# Patient Record
Sex: Male | Born: 1975 | Race: White | Hispanic: No | Marital: Single | Smoking: Never smoker
Health system: Southern US, Community
[De-identification: ages and names within clinical notes are randomized; demographics above are authoritative.]

## PROBLEM LIST (undated history)

## (undated) DIAGNOSIS — R12 Heartburn: Secondary | ICD-10-CM

## (undated) DIAGNOSIS — E663 Overweight: Secondary | ICD-10-CM

## (undated) DIAGNOSIS — E785 Hyperlipidemia, unspecified: Secondary | ICD-10-CM

## (undated) DIAGNOSIS — L409 Psoriasis, unspecified: Secondary | ICD-10-CM

## (undated) DIAGNOSIS — M1990 Unspecified osteoarthritis, unspecified site: Secondary | ICD-10-CM

## (undated) DIAGNOSIS — J351 Hypertrophy of tonsils: Secondary | ICD-10-CM

## (undated) DIAGNOSIS — G4730 Sleep apnea, unspecified: Secondary | ICD-10-CM

## (undated) DIAGNOSIS — G2581 Restless legs syndrome: Secondary | ICD-10-CM

## (undated) HISTORY — DX: Unspecified osteoarthritis, unspecified site: M19.90

## (undated) HISTORY — DX: Hypertrophy of tonsils: J35.1

## (undated) HISTORY — DX: Overweight: E66.3

## (undated) HISTORY — DX: Restless legs syndrome: G25.81

## (undated) HISTORY — DX: Heartburn: R12

## (undated) HISTORY — DX: Sleep apnea, unspecified: G47.30

## (undated) HISTORY — DX: Psoriasis, unspecified: L40.9

## (undated) HISTORY — DX: Hyperlipidemia, unspecified: E78.5

---

## 2001-11-26 ENCOUNTER — Encounter: Payer: Self-pay | Admitting: Emergency Medicine

## 2001-11-26 ENCOUNTER — Emergency Department (HOSPITAL_COMMUNITY): Admission: EM | Admit: 2001-11-26 | Discharge: 2001-11-26 | Payer: Self-pay | Admitting: Emergency Medicine

## 2002-04-16 ENCOUNTER — Emergency Department (HOSPITAL_COMMUNITY): Admission: EM | Admit: 2002-04-16 | Discharge: 2002-04-16 | Payer: Self-pay | Admitting: Emergency Medicine

## 2002-05-02 ENCOUNTER — Encounter: Payer: Self-pay | Admitting: Emergency Medicine

## 2002-05-02 ENCOUNTER — Emergency Department (HOSPITAL_COMMUNITY): Admission: EM | Admit: 2002-05-02 | Discharge: 2002-05-02 | Payer: Self-pay | Admitting: Emergency Medicine

## 2002-05-07 ENCOUNTER — Ambulatory Visit (HOSPITAL_BASED_OUTPATIENT_CLINIC_OR_DEPARTMENT_OTHER): Admission: RE | Admit: 2002-05-07 | Discharge: 2002-05-07 | Payer: Self-pay | Admitting: *Deleted

## 2003-08-05 ENCOUNTER — Emergency Department (HOSPITAL_COMMUNITY): Admission: EM | Admit: 2003-08-05 | Discharge: 2003-08-06 | Payer: Self-pay | Admitting: Emergency Medicine

## 2003-09-19 HISTORY — PX: FINGER SURGERY: SHX640

## 2004-05-17 ENCOUNTER — Encounter: Admission: RE | Admit: 2004-05-17 | Discharge: 2004-05-17 | Payer: Self-pay | Admitting: Family Medicine

## 2006-07-04 ENCOUNTER — Ambulatory Visit (HOSPITAL_COMMUNITY): Payer: Self-pay | Admitting: Psychiatry

## 2006-12-21 ENCOUNTER — Encounter: Admission: RE | Admit: 2006-12-21 | Discharge: 2006-12-21 | Payer: Self-pay | Admitting: Orthopedic Surgery

## 2007-09-19 HISTORY — PX: TONSILLECTOMY: SUR1361

## 2007-10-24 ENCOUNTER — Ambulatory Visit: Payer: Self-pay | Admitting: Pulmonary Disease

## 2007-10-24 DIAGNOSIS — E663 Overweight: Secondary | ICD-10-CM | POA: Insufficient documentation

## 2007-10-24 DIAGNOSIS — G4730 Sleep apnea, unspecified: Secondary | ICD-10-CM | POA: Insufficient documentation

## 2007-10-24 LAB — CONVERTED CEMR LAB: Herpes Simplex Vrs I&II-IgM Ab (EIA): NOT DETECTED

## 2007-10-29 ENCOUNTER — Encounter: Payer: Self-pay | Admitting: Pulmonary Disease

## 2007-10-29 ENCOUNTER — Ambulatory Visit (HOSPITAL_BASED_OUTPATIENT_CLINIC_OR_DEPARTMENT_OTHER): Admission: RE | Admit: 2007-10-29 | Discharge: 2007-10-29 | Payer: Self-pay | Admitting: Pulmonary Disease

## 2007-11-07 ENCOUNTER — Encounter: Payer: Self-pay | Admitting: Internal Medicine

## 2007-11-11 ENCOUNTER — Telehealth (INDEPENDENT_AMBULATORY_CARE_PROVIDER_SITE_OTHER): Payer: Self-pay | Admitting: *Deleted

## 2007-11-13 ENCOUNTER — Ambulatory Visit: Payer: Self-pay | Admitting: Pulmonary Disease

## 2007-11-26 LAB — CONVERTED CEMR LAB
ALT: 36 units/L (ref 0–53)
Albumin: 4.3 g/dL (ref 3.5–5.2)
Alkaline Phosphatase: 59 units/L (ref 39–117)
BUN: 8 mg/dL (ref 6–23)
Basophils Relative: 1.2 % — ABNORMAL HIGH (ref 0.0–1.0)
CO2: 33 meq/L — ABNORMAL HIGH (ref 19–32)
Calcium: 9.6 mg/dL (ref 8.4–10.5)
Creatinine, Ser: 1 mg/dL (ref 0.4–1.5)
Crystals: NEGATIVE
Eosinophils Relative: 2 % (ref 0.0–5.0)
GFR calc Af Amer: 111 mL/min
HDL: 32.8 mg/dL — ABNORMAL LOW (ref >39.0)
Ketones, ur: NEGATIVE mg/dL
Monocytes Relative: 7.2 % (ref 3.0–11.0)
Platelets: 185 10*3/uL (ref 150–400)
Potassium: 4.3 meq/L (ref 3.5–5.1)
RBC / HPF: NONE SEEN
RBC: 5.71 M/uL (ref 4.22–5.81)
RDW: 12.5 % (ref 11.5–14.6)
Specific Gravity, Urine: 1.02 (ref 1.000–1.03)
Squamous Epithelial / LPF: NEGATIVE /lpf
TSH: 0.99 microintl units/mL (ref 0.35–5.50)
Total CHOL/HDL Ratio: 7.1
Total Protein: 7.3 g/dL (ref 6.0–8.3)
Triglycerides: 123 mg/dL (ref 0–149)
Urine Glucose: NEGATIVE mg/dL
VLDL: 25 mg/dL (ref 0–40)
WBC: 8.9 10*3/uL (ref 4.5–10.5)

## 2008-02-14 ENCOUNTER — Encounter: Payer: Self-pay | Admitting: Pulmonary Disease

## 2008-02-19 ENCOUNTER — Encounter (INDEPENDENT_AMBULATORY_CARE_PROVIDER_SITE_OTHER): Payer: Self-pay | Admitting: Otolaryngology

## 2008-02-19 ENCOUNTER — Observation Stay (HOSPITAL_COMMUNITY): Admission: RE | Admit: 2008-02-19 | Discharge: 2008-02-20 | Payer: Self-pay | Admitting: Otolaryngology

## 2008-04-29 ENCOUNTER — Ambulatory Visit (HOSPITAL_BASED_OUTPATIENT_CLINIC_OR_DEPARTMENT_OTHER): Admission: RE | Admit: 2008-04-29 | Discharge: 2008-04-29 | Payer: Self-pay | Admitting: Otolaryngology

## 2008-05-02 ENCOUNTER — Ambulatory Visit: Payer: Self-pay | Admitting: Internal Medicine

## 2008-11-24 ENCOUNTER — Encounter: Payer: Self-pay | Admitting: Pulmonary Disease

## 2009-03-16 ENCOUNTER — Encounter: Payer: Self-pay | Admitting: Adult Health

## 2009-03-16 ENCOUNTER — Ambulatory Visit: Payer: Self-pay | Admitting: Internal Medicine

## 2009-03-18 LAB — CONVERTED CEMR LAB
BUN: 27 mg/dL — ABNORMAL HIGH (ref 6–23)
Basophils Relative: 0.1 % (ref 0.0–3.0)
CO2: 30 meq/L (ref 19–32)
Chloride: 105 meq/L (ref 96–112)
Eosinophils Relative: 2.2 % (ref 0.0–5.0)
GFR calc non Af Amer: 81.43 mL/min (ref >60)
Glucose, Bld: 82 mg/dL (ref 70–99)
Ketones, ur: NEGATIVE mg/dL
Leukocytes, UA: NEGATIVE
Lymphocytes Relative: 30.7 % (ref 12.0–46.0)
Monocytes Absolute: 0.4 10*3/uL (ref 0.1–1.0)
Monocytes Relative: 6.7 % (ref 3.0–12.0)
Neutrophils Relative %: 60.3 % (ref 43.0–77.0)
Platelets: 169 10*3/uL (ref 150.0–400.0)
Potassium: 4 meq/L (ref 3.5–5.1)
RBC: 4.76 M/uL (ref 4.22–5.81)
Sodium: 142 meq/L (ref 135–145)
Specific Gravity, Urine: 1.025 (ref 1.000–1.030)
Total Protein, Urine: NEGATIVE mg/dL
WBC: 6.1 10*3/uL (ref 4.5–10.5)
pH: 6 (ref 5.0–8.0)

## 2009-04-19 IMAGING — CR DG CHEST 2V
2 series · 2 of 2 positions shown · non-contrast
Comparison: none

CLINICAL DATA: Wellness visit. 
 CHEST - 2 VIEW:

[view not recorded (1 of 2)]
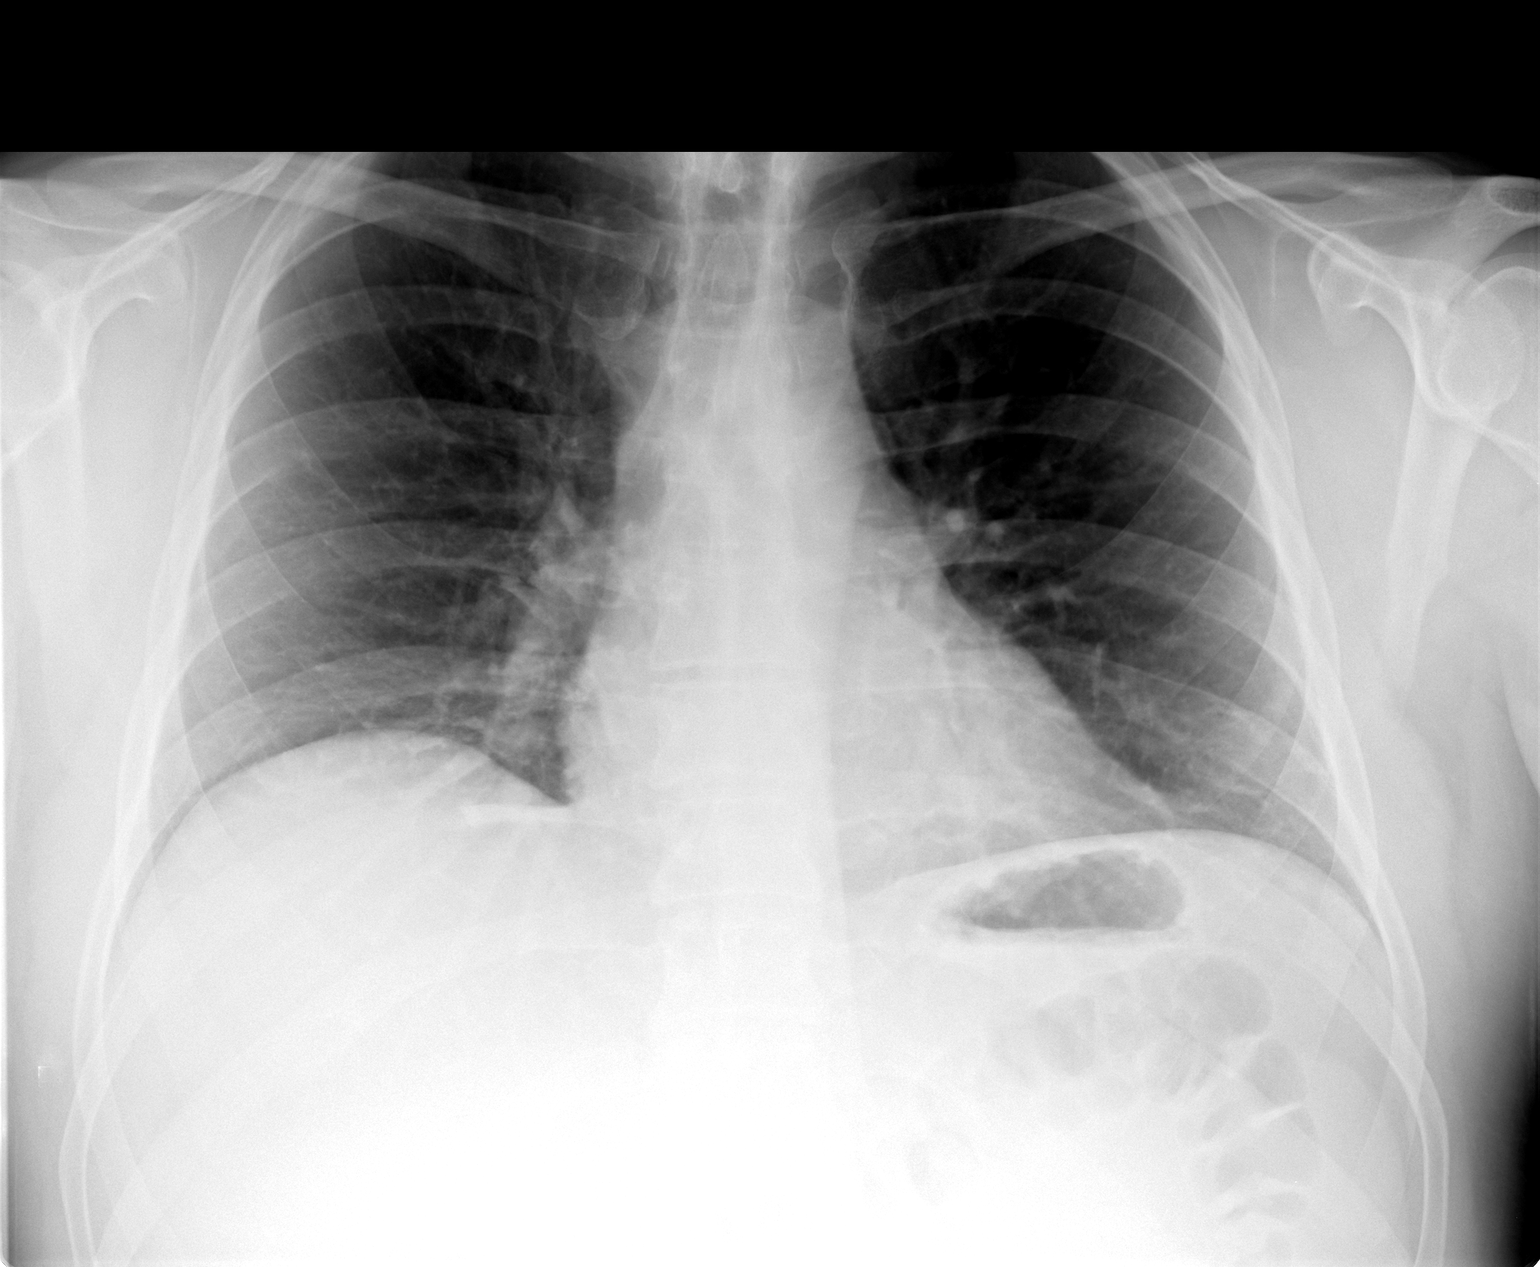

[view not recorded (2 of 2)]
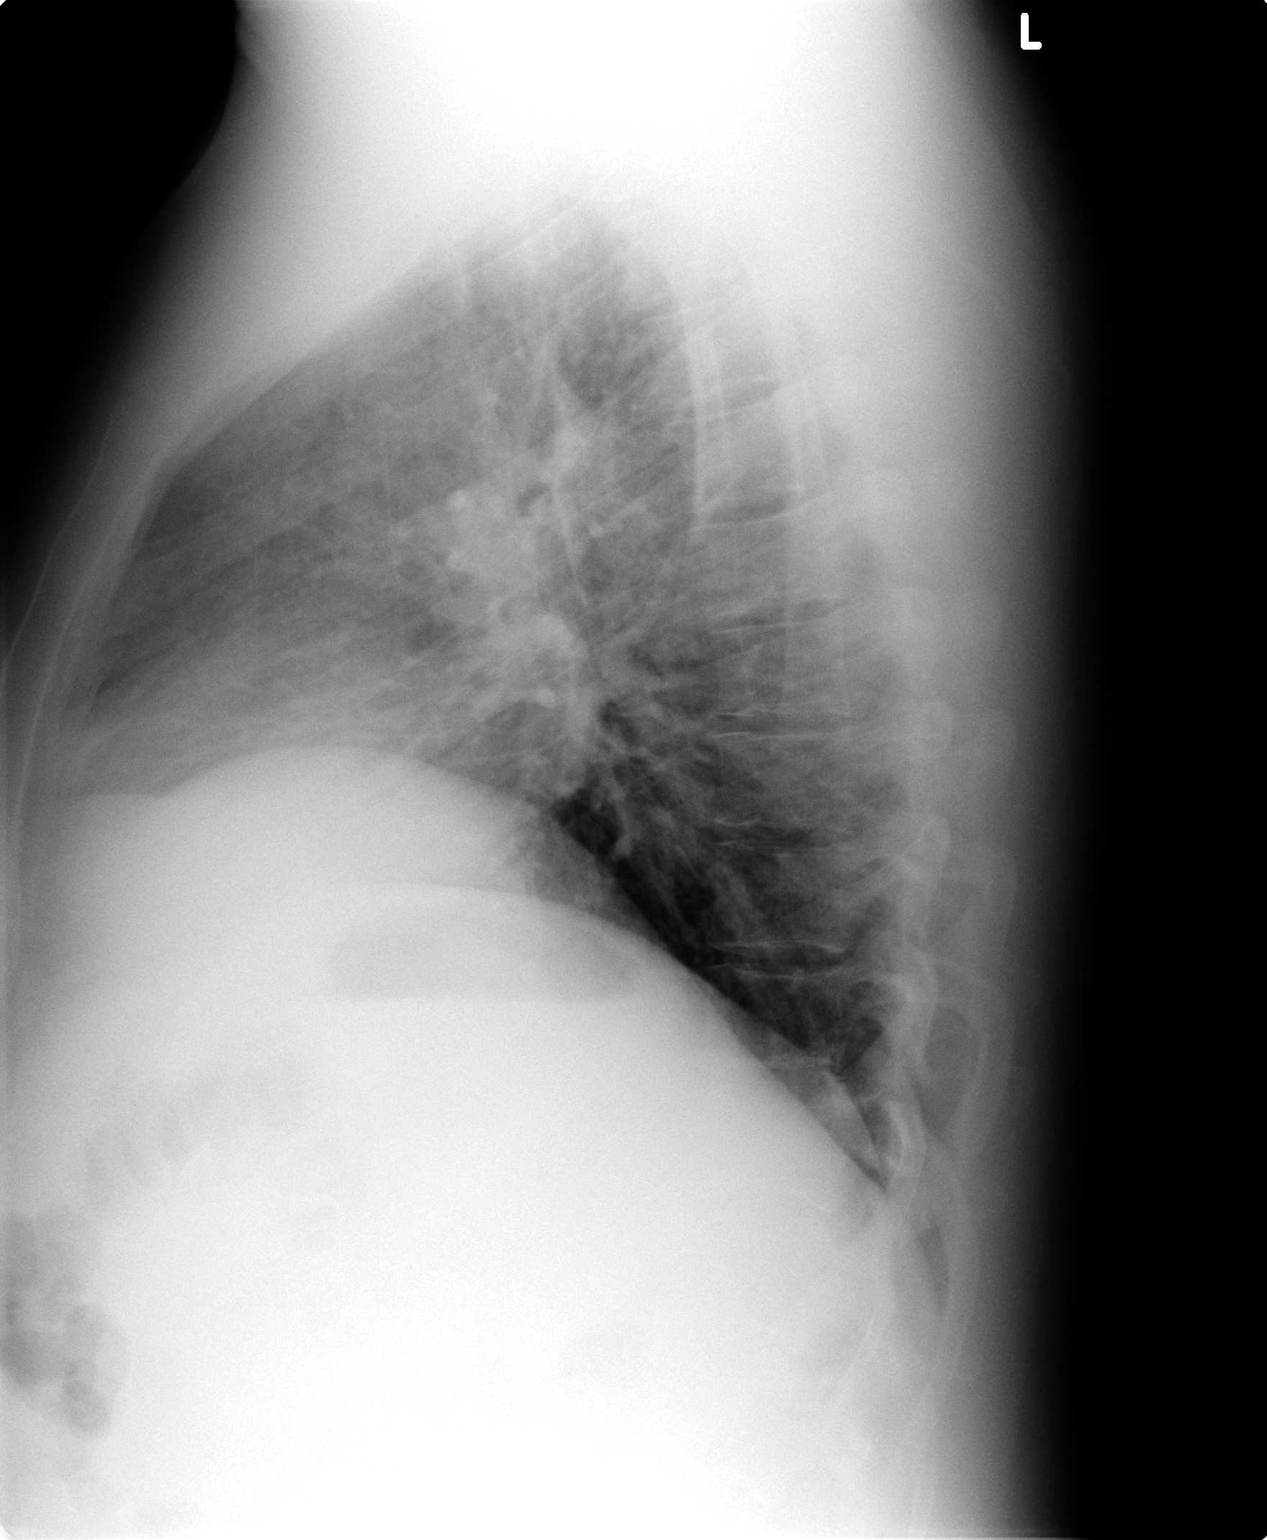

[2 of 2 positions shown; findings below may reference images not displayed]

FINDINGS: Heart size and mediastinal contours are normal.  
 There is no pleural fluid or pulmonary edema.  
 No airspace densities are identified.  
 The lung volumes are low.  There is atelectasis versus scarring at the left base.
IMPRESSION: 1.  Low lung volumes. 
 2.  Left base scar versus atelectasis.

## 2009-10-12 ENCOUNTER — Ambulatory Visit: Payer: Self-pay | Admitting: Pulmonary Disease

## 2009-10-12 DIAGNOSIS — G2581 Restless legs syndrome: Secondary | ICD-10-CM | POA: Insufficient documentation

## 2009-10-15 LAB — CONVERTED CEMR LAB
AST: 23 units/L (ref 0–37)
Alkaline Phosphatase: 41 units/L (ref 39–117)
Basophils Relative: 0.9 % (ref 0.0–3.0)
Chloride: 102 meq/L (ref 96–112)
Eosinophils Relative: 2.1 % (ref 0.0–5.0)
Hemoglobin: 14.9 g/dL (ref 13.0–17.0)
Lymphocytes Relative: 23.6 % (ref 12.0–46.0)
MCV: 89.5 fL (ref 78.0–100.0)
Monocytes Absolute: 0.4 10*3/uL (ref 0.1–1.0)
Neutro Abs: 5.4 10*3/uL (ref 1.4–7.7)
Neutrophils Relative %: 67.9 % (ref 43.0–77.0)
Potassium: 4 meq/L (ref 3.5–5.1)
RBC: 4.97 M/uL (ref 4.22–5.81)
Saturation Ratios: 19.2 % — ABNORMAL LOW (ref 20.0–50.0)
TSH: 0.72 microintl units/mL (ref 0.35–5.50)
Total Bilirubin: 1.1 mg/dL (ref 0.3–1.2)
Transferrin: 208.1 mg/dL — ABNORMAL LOW (ref 212.0–360.0)
WBC: 8 10*3/uL (ref 4.5–10.5)

## 2009-11-23 ENCOUNTER — Ambulatory Visit: Payer: Self-pay | Admitting: Pulmonary Disease

## 2009-11-23 DIAGNOSIS — E7800 Pure hypercholesterolemia, unspecified: Secondary | ICD-10-CM | POA: Insufficient documentation

## 2009-11-23 DIAGNOSIS — K649 Unspecified hemorrhoids: Secondary | ICD-10-CM | POA: Insufficient documentation

## 2009-11-23 DIAGNOSIS — R12 Heartburn: Secondary | ICD-10-CM | POA: Insufficient documentation

## 2009-12-16 ENCOUNTER — Ambulatory Visit: Payer: Self-pay | Admitting: Pulmonary Disease

## 2009-12-16 DIAGNOSIS — G4733 Obstructive sleep apnea (adult) (pediatric): Secondary | ICD-10-CM | POA: Insufficient documentation

## 2010-03-12 ENCOUNTER — Encounter: Payer: Self-pay | Admitting: Pulmonary Disease

## 2010-10-18 NOTE — Miscellaneous (Signed)
Summary: auto shows good compliance, and pressure 13cm  Clinical Lists Changes  Orders: Added new Referral order of DME Referral (DME) - Signed auto shows good compliance, no leaks, and optimal pressure of 13cm

## 2010-10-18 NOTE — Assessment & Plan Note (Signed)
Summary: Acute NP office visit - ? RLS   CC:  c/o tingling/cramping in legs while trying to sleep x3-79months.  History of Present Illness: 35 year old with known history of GERD.   10/24/07-  new pt for a CPX... he is the son of Laythan Hayter who passes away last yr w/ COPD/ emphysema, lung cancer, & cirrhosis... he has enjoyed good general medical health but has put on some weight assoc w/ overeating and not exercising... he is concerned about poss OSA as he snores quite a bit and he is freq tired in the morning and hard to get up... 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 he is also concerned about heartburn and gas... and finally he wants full labs to be checked including tests for poss STDs (no symptoms or discharge)...   March 16, 2009--Presents for work in visit. Complains of stomach pain at the navel toward the right side x2weeks, concentrated when bending over or turning to the right. Saw a red spot on mid abd last night. Has been have hearburn, gas and bloating. PT has been exercising and eating good over last year. lost >40lbs, has been running 5 miles 5/7 days, eating better. working hard over last year. going gym most everyday- 5-6/7 days.    October 12, 2009--Presents for a work in visit. Complains of c/o tingling/cramping in legs while trying to sleep x3-78months. Mainly occurs at bedtime. Has trouble falling asleep. Legs feel tight , move constantly when he tries to lie down. Feels like he can not sit still at times. Has not been wearing CPAP until recently  b/c it is hard to fall asleep. Denies chest pain,   orthopnea, hemoptysis, fever, n/v/d, edema, headache. new meds.   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 s/p tonsillectomy 2009.  SLEEP APNEA (ICD-780.57) - Mod OSA on noct. CPAP,  OVERWEIGHT (ICD-278.02) - wt=251#, 6'2" tall, BMI=32-33 HEARTBURN (ICD-787.1) - we discussed Rx w/ PRILOSEC OTC... and avoiding spicey foods. INTESTINAL GAS (ICD-787.3) - we discussed SIMETHACONE Rx for gas... ?RLS- started requip 1mg  at bedtime October 12, 2009      Vital Signs:  Patient profile:   35 year old male Height:      74 inches Weight:      225.25 pounds BMI:     29.02 O2 Sat:      98 % on Room air Temp:     97.1 degrees F oral Pulse rate:   61 / minute BP sitting:   132 / 86  (right arm) Cuff size:   regular  Vitals Entered By: Boone Master CNA (October 12, 2009 2:43 PM)  O2 Flow:  Room air CC: c/o tingling/cramping in legs while trying to sleep x3-74months Is Patient Diabetic? No Comments Medications reviewed with patient Daytime contact number verified with patient. Boone Master CNA  October 12, 2009 2:44 PM    Physical Exam  Additional Exam:  WD, WN, 35 y/o WM in NAD... GENERAL:  Alert & oriented; pleasant & cooperative. HEENT:  [place]/AT,  A, Fundi-benign, EACs-clear, TMs-wnl, NOSE-clear, THROAT-clear & wnl. NECK:  Supple w/ full ROM; no JVD; normal carotid impulses w/o bruits; no thyromegaly or nodules palpated; no lymphadenopathy. CHEST:  Clear to P & A; without wheezes/ rales/ or rhonchi. HEART:  Regular Rhythm; without murmurs/ rubs/ or gallops. ABDOMEN:  Soft & nontender; normal bowel sounds; no organomegaly or masses detected EXT: without  deformities or arthritic changes; no varicose veins/ venous insuffic/ or edema. NEURO:   intact no focal deficits noted.  DERM:  no rash or redness is noted. unable to detect red area that pt described.      Impression & Recommendations:  Problem # 1:  RESTLESS LEG SYNDROME (ICD-333.94) Suspect his symptoms are underlying RLS reviewed sleep study from 09- no limb mentioned.  will check labs.  Rec  begin Requip 1mg  1/2 tab at bedtime for 1 week then 1 mg at bedtime  stretching and exercsie  Orders: TLB-CBC Platelet - w/Differential (85025-CBCD) TLB-IBC Pnl (Iron/FE;Transferrin) (83550-IBC) TLB-BMP (Basic Metabolic Panel-BMET) (80048-METABOL) TLB-TSH (Thyroid Stimulating Hormone) (84443-TSH) TLB-Hepatic/Liver Function Pnl (80076-HEPATIC) Est. Patient Level IV (60454)  Problem # 2:  SLEEP APNEA (ICD-780.57)  nocturnal CPAP-wear nightly.   Orders: Est. Patient Level IV (09811)  Medications Added to Medication List This Visit: 1)  Ropinirole Hcl 1 Mg Tabs (Ropinirole hcl) .... 1/2 by mouth at bedtime for 1 week then 1 tab at bedtime  Complete Medication List: 1)  Ropinirole Hcl 1 Mg Tabs (Ropinirole hcl) .... 1/2 by mouth at bedtime for 1 week then 1 tab at bedtime  Patient Instructions: 1)  Begin Requip 1mg  1/2 at bedtime for 1 week, then 1 mg at bedtime  2)  Stretch legs daily , warm heat to legs prior to bedtime.  3)  I will call with labs.  4)  Please contact office for sooner follow up if symptoms do not improve or worsen  5)  follow up 6 weeks Dr. Kriste Basque  Prescriptions: ROPINIROLE HCL 1 MG TABS (ROPINIROLE HCL) 1/2 by mouth at bedtime for 1 week then 1 tab at bedtime  #30 x 5   Entered and Authorized by:   Rubye Oaks NP   Signed by:   Rubye Oaks NP on 10/12/2009   Method used:   Electronically to        [facility] [phone]* (retail)       [address]       [place], [place]  [address]       Ph: [phone]       Fax: [phone]   RxID:    [id number]    Immunization History:  Influenza Immunization History:    Influenza:  declines (10/12/2009)  Pneumovax Immunization History:    Pneumovax:  n/a (10/12/2009)

## 2010-10-18 NOTE — Assessment & Plan Note (Signed)
Summary: consult for osa   Visit Type:  Initial Consult Copy to:  Dr. Alroy Dust Primary Provider/Referring Provider:  Dr. Alroy Dust  CC:  OSA.  History of Present Illness: The pt is a 35y/o male who I have been asked to see for osa management.  He was first diagnosed with osa in 2009, where he had an AHI of 24/hr with desat to 79%.  He underwent tonsillectomy, with improvement in his snoring, but still had nonrestorative sleep.  He had f/u sleep study that is unavailable, and apparently showed persistent osa, and therefore agreed to try cpap.  He was started at 12cm, with resolution of snoring, and does feel better when he wears the device consistently.  He currently wears at least 4 nights btw "Sunday and Thursday when he sleeps in a different bed, but doesn't wear at all on Fri/Sat when he sleeps with his wife.  Overall, he does not feel he is sleeping as well currently.  He goes to bed at 9-10pm, and arises at 5am to start his day.  He is fairly rested if he wears cpap.  His current mask is 36 years old, and needs to be replaced.  He denies any significant EDS.  His weight is down 40 pounds the last 2 years by his history.  Current Medications (verified): 1)  Ropinirole Hcl 1 Mg Tabs (Ropinirole Hcl) .... Take 1 Tab By Mouth At Bedtime As Needed  Allergies (verified): No Known Drug Allergies  Past History:  Past Medical History: Reviewed history from 11/23/2009 and no changes required.  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 S/P ORIF Left 5th prox phalanx fracture S/P Tonsillectomy (&uvulectomy) 6/09 by DrShoemaker  Family History: Reviewed history from 11/23/2009 and no changes required. Father died 2008, age 60 w/ COPD,  lung cancer, cirrhosis (Etoh) PGM - cancer P aunt - cancer  Social History: Reviewed history from 11/23/2009 and no changes required. 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    Vital Signs:  Patient profile:   35 year old male Weight:      222 pounds O2 Sat:      98 % Temp:     97" .8 degrees F oral Pulse rate:   55 / minute BP sitting:   116 / 70  (left arm)  Vitals Entered By: Vernie Murders (December 16, 2009 3:14 PM)  Physical Exam  General:  ow male in nad. Eyes:  PERRLA and EOMI.   Nose:  deviated septum to left with near obstruction, large turbinates on right Mouth:  trimmed palate and uvula Neck:  no jvd, tmg, LN Lungs:  clear to auscultation Heart:  rrr, no mrg Abdomen:  soft and nontender, bs+ Extremities:  no edema noted, no cyanosis pulses intact distally Neurologic:  alert and oriented, moves all 4.   Impression & Recommendations:  Problem # 1:  OBSTRUCTIVE SLEEP APNEA (ICD-327.23)  the pt has known osa, and is overall doing ok with cpap.  I have explained to him that he will get more out of cpap therapy if he wears everynight.  We need to get him a new mask, and I also think it is worthwhile re-optimizing his cpap pressure since it has been quite some time and he has had weight shifts.  The pt is agreeable to all of this.  Ultimately, weight loss is the treatment of choice.  Medications Added to  Medication List This Visit: 1)  Ropinirole Hcl 1 Mg Tabs (Ropinirole hcl) .... Take 1 tab by mouth at bedtime as needed  Other Orders: Consultation Level IV (16109) DME Referral (DME)  Patient Instructions: 1)  stay on cpap as much as possible 2)  will get you new full face mask 3)  will have dme re-optimize your pressure with auto machine for 2 weeks.  I will let you know the results. 4)  continue to work on weight loss. 5)  followup with me in one year if doing well.  If you lose weight down to under 200, let me know.

## 2010-10-18 NOTE — Assessment & Plan Note (Signed)
Summary: 6 weeks/ok per leigh/apc   CC:  2 year ROV & review of medical problems....  History of Present Illness: 35 y/o WM here for a follow up visit...    ~  Feb09:  he is the son of Keyontae Huckeby who passed away 02/20/07 w/ COPD/ emphysema, lung cancer, & cirrhosis... he has enjoyed good general medical health but has put on some weight assoc w/ overeating and not exercising... he is concerned about poss OSA as he snores quite a bit and he is freq tired in the morning and hard to get up... 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 he has marked tonsillar hpertrophy on exam & this is surely contributing to his symptoms... he is also concerned about heartburn and gas... and finally he wants full labs to be checked including tests for poss STDs (no symptoms or discharge)...   ~  November 23, 2009:  2 yr follow up visit>> after I saw him 2/09- he had sleep study showing mod OSA w/ an AHI= 24 events per hour & desat to 79%... he saw DrShoemaker 5/09 & he agreed w/ tonsillectomy- done 6/09 (& uvulectomy done as well although not commented on in the op note)... DrShoemaker sent him for a CPAP titration study 8/09 w/ titration to 10cm & 0 AHI & sats at 95% w/ med [employer] full face mask... he has lost 30# down to 215#... he reports that he & his wife sleep in sep rooms (5-6/7 nights) due to his CPAP & RLS (improved on Requip 1mg )... he does fairly well w/ the CPAP but recently having some difficulty- waking to urinate, using ramp feature, occas can't use the mask... notes definite diff in how he feels on the morning after he didn't use the CPAP... we discussed checking download, try nasal pillows, sleep f/u w/ DrClance... also c/o some puritis ani- rec> Anusol[facility].   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  SLEEP APNEA (ICD-780.57)  ** SEE ABOVE **  HYPERCHOLESTEROLEMIA (ICD-272.0) - on diet alone...  ~  FLP 2/09 showed TChol 232, TG 123, HDL 33, LDL 165... he prefers diet Rx.  OVERWEIGHT (ICD-278.02) -   ~  weight 2/09 = 251#, 6'2" tall, BMI=32-33  ~  weight 3/11 = 215#, BMI = 27-28  Hx of HEARTBURN (ICD-787.1) - prev Rx w/ PRILOSEC OTC Prn... and avoiding spicey foods!  HEMORRHOIDS (ICD-455.6) - c/o puritis ani symptoms w/ int hems noted- Rx ANUSOL HC cream Prn...  RESTLESS LEG SYNDROME (ICD-333.94) - started on REQUIP 1mg Qhs 1/11 w/ improvement...   Allergies (verified): No Known Drug Allergies  Comments:  Nurse/Medical Assistant: The patient's medications and allergies were reviewed with the patient and were updated in the Medication and Allergy Lists.  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) SLEEP APNEA (ICD-780.57) HYPERCHOLESTEROLEMIA (ICD-272.0) OVERWEIGHT (ICD-278.02) Hx of HEARTBURN (ICD-787.1) HEMORRHOIDS (ICD-455.6) RESTLESS LEG SYNDROME (ICD-333.94)  Past Surgical History: S/P ORIF Left 5th prox phalanx fracture S/P Tonsillectomy (&uvulectomy) 6/09 by DrShoemaker  Family History: Father died 2007/02/20, age 66 w/ COPD, lung cancer, cirrhosis (Etoh) PGM - cancer P aunt - cancer  Social History: Married No children Never smoked No Etoh Employ: [employer]  Review of Systems  See HPI  The patient denies anorexia, fever, weight loss, weight gain, vision loss, decreased hearing, hoarseness, chest pain, syncope, dyspnea on exertion, peripheral edema, prolonged cough, headaches, hemoptysis, abdominal pain, melena, hematochezia, severe indigestion/heartburn, hematuria, incontinence, muscle weakness, suspicious skin lesions, transient blindness, difficulty walking, depression, unusual weight change, abnormal bleeding, enlarged lymph nodes, and angioedema.         C/o some  sleepiness when he doesn't wear his CPAP... notes some puritis ani symptoms as well...  Vital Signs:  Patient profile:   35 year old male Height:      74 inches Weight:      215.13 pounds BMI:     27.72 O2 Sat:      98 % on Room air Temp:     98.0 degrees F oral Pulse rate:   57 / minute BP sitting:   124 / 66  (right arm) Cuff size:   regular  Vitals Entered By: Randell Loop CMA (November 23, 2009 2:56 PM)  O2 Sat at Rest %:  98 O2 Flow:  Room air CC: 2 year ROV & review of medical problems... Is Patient Diabetic? No Pain Assessment Patient in pain? no      Comments no changes in meds today   Physical Exam  Additional Exam:  WD, WN, 35 y/o WM in NAD... GENERAL:  Alert & oriented; pleasant & cooperative... HEENT:  /AT, EACs-clear, TMs-wnl, NOSE-clear, THROAT-clear & wnl. NECK:  Supple w/ full ROM; no JVD; normal carotid impulses w/o bruits; no thyromegaly or nodules palpated; no lymphadenopathy. CHEST:  Clear to P & A; without wheezes/ rales/ or rhonchi. HEART:  Regular Rhythm; without murmurs/ rubs/ or gallops. ABDOMEN:  Soft & nontender; normal bowel sounds; no organomegaly or masses detected... rectal area sl red- no ext hems or lesions noted... EXT:  without deformities or arthritic changes; no varicose veins/ venous insuffic/ or edema. NEURO:  intact, no focal deficits noted.  DERM:  no rash or lesions noted...    MISC. Report  Procedure date:  11/23/2009  Findings:      NOTES & LABS from 03/16/09 & 10/12/09 are reviewed...   Impression & Recommendations:  Problem # 1:  SLEEP APNEA (ICD-780.57) He has mod OSA- unfortunately not much better after tonsillectomy & losing weight... +- stable on CPAP but w/ a number of complaints related to the CPAP & mask... I suggested to him to try the nasal pillows he has at home, get download information, consider sleep consult DrClance...  Orders: Sleep Disorder Referral (Sleep Disorder)  Problem # 2:  RESTLESS LEG  SYNDROME (ICD-333.94) Improved w/ the REQUIP but only taking 1/2 mg Qhs... rec to incr to 1mg  Qhs regularly to assess response.  Problem # 3:  HYPERCHOLESTEROLEMIA (ICD-272.0) He hasn't had FLP since 2/09... rec best efforts at low chol/ low fat diet & follow up FLP later.  Problem # 4:  HEMORRHOIDS (ICD-455.6) Notes c/o puritis ani-  rec ANUSOL HC Prn...  Problem # 5:  OVERWEIGHT (ICD-278.02) Good job w/ weight reduction... discussed diet + exercise.  Complete Medication List: 1)  Ropinirole Hcl 1 Mg Tabs (Ropinirole hcl) .... Take 1 tab by mouth at bedtime.Marland KitchenMarland Kitchen 2)  Hydrocortisone 2.5 % Crea (Hydrocortisone) .... Apply as directed...  Other Orders: Prescription Created Electronically [id number])  Patient Instructions: 1)  Today we updated your med list- see below.... 2)  We refilled the generic REQUIP- take one at bedtime.Marland KitchenMarland Kitchen 3)  We wrote a new perscription for generic ANUSOL HC hemorrhoid cream  to use as we discussed... 4)  We will arrange for an appt w/ DrClance regarding your sleep apnea... 5)  Call for any questions... Prescriptions: HYDROCORTISONE 2.5 % CREA (HYDROCORTISONE) Apply as directed...  #1 tube x prn   Entered and Authorized by:   Michele Mcalpine MD   Signed by:   Michele Mcalpine MD on 11/23/2009   Method used:   Print then Give to Patient   RxID:   [id number] ROPINIROLE HCL 1 MG TABS (ROPINIROLE HCL) take 1 tab by mouth at bedtime...  #30 x prn   Entered and Authorized by:   Michele Mcalpine MD   Signed by:   Michele Mcalpine MD on 11/23/2009   Method used:   Print then Give to Patient   RxID:   [id number]

## 2011-01-06 ENCOUNTER — Telehealth: Payer: Self-pay | Admitting: Pulmonary Disease

## 2011-01-06 NOTE — Telephone Encounter (Signed)
Td spoke w/ pt and requested recs from [facility]

## 2011-01-10 ENCOUNTER — Other Ambulatory Visit: Payer: Self-pay | Admitting: Pulmonary Disease

## 2011-01-10 DIAGNOSIS — R55 Syncope and collapse: Secondary | ICD-10-CM

## 2011-01-11 ENCOUNTER — Encounter: Payer: Self-pay | Admitting: Internal Medicine

## 2011-01-11 ENCOUNTER — Encounter: Payer: Self-pay | Admitting: Pulmonary Disease

## 2011-01-11 ENCOUNTER — Ambulatory Visit (INDEPENDENT_AMBULATORY_CARE_PROVIDER_SITE_OTHER): Payer: Self-pay | Admitting: Pulmonary Disease

## 2011-01-11 DIAGNOSIS — R55 Syncope and collapse: Secondary | ICD-10-CM

## 2011-01-11 DIAGNOSIS — G4733 Obstructive sleep apnea (adult) (pediatric): Secondary | ICD-10-CM

## 2011-01-11 DIAGNOSIS — E7800 Pure hypercholesterolemia, unspecified: Secondary | ICD-10-CM

## 2011-01-11 DIAGNOSIS — E663 Overweight: Secondary | ICD-10-CM
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
## 2011-01-12 ENCOUNTER — Ambulatory Visit (INDEPENDENT_AMBULATORY_CARE_PROVIDER_SITE_OTHER): Payer: Self-pay | Admitting: Internal Medicine

## 2011-01-12 ENCOUNTER — Encounter: Payer: Self-pay | Admitting: Internal Medicine

## 2011-01-12 VITALS — BP 117/80 | HR 64 | Ht 74.0 in | Wt 208.0 lb

## 2011-01-12 DIAGNOSIS — R55 Syncope and collapse: Secondary | ICD-10-CM

## 2011-01-12 DIAGNOSIS — G4733 Obstructive sleep apnea (adult) (pediatric): Secondary | ICD-10-CM
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
## 2011-01-12 NOTE — Patient Instructions (Signed)
Your physician recommends that you schedule a follow-up appointment in: 4 weeks  Your physician has recommended that you wear a holter monitor. Holter monitors are medical devices that record the heart's electrical activity. Doctors most often use these monitors to diagnose arrhythmias. Arrhythmias are problems with the speed or rhythm of the heartbeat. The monitor is a small, portable device. You can wear one while you do your normal daily activities. This is usually used to diagnose what is causing palpitations/syncope (passing out) 48 hour. ---syncope  No Driving
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
## 2011-01-12 NOTE — Assessment & Plan Note (Signed)
Compliance with CPAP is advised The patient has scheduled follow-up with Dr Shelle Iron

## 2011-01-16 ENCOUNTER — Encounter: Payer: Self-pay | Admitting: Pulmonary Disease

## 2011-01-16 ENCOUNTER — Ambulatory Visit (INDEPENDENT_AMBULATORY_CARE_PROVIDER_SITE_OTHER): Payer: 59 | Admitting: Pulmonary Disease

## 2011-01-16 DIAGNOSIS — G2581 Restless legs syndrome: Secondary | ICD-10-CM

## 2011-01-16 DIAGNOSIS — G4733 Obstructive sleep apnea (adult) (pediatric): Secondary | ICD-10-CM

## 2011-01-16 MED ORDER — ROPINIROLE HCL 1 MG PO TABS: ORAL_TABLET | ORAL | Status: DC

## 2011-01-16 NOTE — Assessment & Plan Note (Signed)
The pt has a lot of leg symptoms in the evening before going to bed, and once he gets into bed as well.  His history is very suggestive of RLS.  He has been on requip at one time for this with success, but currently is not taking.  Will restart since this can affect sleep and cause EDS.

## 2011-01-16 NOTE — Patient Instructions (Signed)
Will optimize cpap pressure again for you on auto mode.  You need to wear this everynight Get back on requip 1mg  each night after dinner for your leg movements Work on weight loss If doing well with cpap, followup with me in 6mos
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
## 2011-01-18 ENCOUNTER — Encounter (INDEPENDENT_AMBULATORY_CARE_PROVIDER_SITE_OTHER): Payer: 59

## 2011-01-18 DIAGNOSIS — R002 Palpitations: Secondary | ICD-10-CM

## 2011-01-19 ENCOUNTER — Telehealth: Payer: Self-pay | Admitting: Internal Medicine

## 2011-01-19 NOTE — Telephone Encounter (Signed)
Walk On Pt Form " Pt Dropped Off [employer] paper for Completion" sent to [employer] 01/19/11/km

## 2011-01-23 ENCOUNTER — Encounter: Payer: Self-pay | Admitting: Pulmonary Disease

## 2011-01-23 ENCOUNTER — Telehealth: Payer: Self-pay | Admitting: Internal Medicine

## 2011-01-23 NOTE — Telephone Encounter (Signed)
Spoke with patient and gave him his monitor results  Let him know that we do have his paper work for disability and a release form has

## 2011-01-23 NOTE — Telephone Encounter (Signed)
Pt needs short term disability letter that he brought here on 01-17-11, also he said dr allred verbally told him he was to be OUT of work for 3 months, then on his rtn to work note it just stated no driving with no ending date, wants a new letter-wants a call when ready, will pick up

## 2011-01-31 NOTE — Procedures (Signed)
NAME:  David Gamble, David Gamble NO.:  [id number]   MEDICAL RECORD NO.:  [id number]          PATIENT TYPE:  OUT   LOCATION:  SLEEP CENTER                 FACILITY[facility]  [facility]   PHYSICIAN:  Barbaraann Share, MD,FCCPDATE OF BIRTH:  1975/08/29   DATE OF STUDY:  10/29/2007                            NOCTURNAL POLYSOMNOGRAM   REFERRING PHYSICIAN:  Lonzo Cloud. Kriste Basque, MD   INDICATION FOR STUDY:  Hypersomnia with sleep apnea.   EPWORTH SLEEPINESS SCORE:  Was 10.   MEDICATIONS:   SLEEP ARCHITECTURE:  The patient had a total sleep time of 275 minutes  with decreased slow wave sleep, as well as REM.  Sleep onset latency was  normal at 14 minutes, but REM onset was prolonged at 295 minutes.  Sleep  efficiency was decreased at 70%.   RESPIRATORY DATA:  The patient was found to have 60 apneas and 49  hypopneas for an apnea/hypopnea index of 24 events per hour and O2  desaturation as low as 79%.  The events were not positional and there  was mild snoring noted throughout.   OXYGEN DATA:  There was O2 desaturation as low as 79% with the patient's  obstructive events.   CARDIAC DATA:  No clinically significant arrhythmias were noted.   MOVEMENT/PARASOMNIA:  None.   IMPRESSIONS/RECOMMENDATIONS:  Moderate obstructive sleep apnea/hypopnea  syndrome with an apnea/hypopnea index of 24 events per hour and an O2  desaturation as low as 79%.  Treatment for this degree of sleep apnea  can include weight loss alone if applicable, upper airway surgery or  appliance and also CPAP.  Clinical correlation is suggested.      Barbaraann Share, MD,FCCP  Diplomate, American Board of Sleep  Medicine  Electronically Signed    [facility]/MEDQ  D:  11/13/2007 07:32:23  T:  11/13/2007 15:03:36  Job:  [id number]

## 2011-01-31 NOTE — Op Note (Signed)
NAME:  [name], David Gamble NO.:  [id number]   MEDICAL RECORD NO.:  [id number]          PATIENT TYPE:  OBV   LOCATION:  3314                         FACILITY:  [facility]   PHYSICIAN:  Kinnie Scales. Annalee Genta, M.D.DATE OF BIRTH:  1975/11/08   DATE OF PROCEDURE:  02/19/2008  DATE OF DISCHARGE:                               OPERATIVE REPORT   LOCATION:  [facility].   PREOPERATIVE DIAGNOSES:  1. Tonsillar hypertrophy.  2. Mild obstructive sleep apnea.   POSTOPERATIVE DIAGNOSES:  1. Tonsillar hypertrophy.  2. Mild obstructive sleep apnea.   INDICATIONS FOR SURGERY:  1. Tonsillar hypertrophy.  2. Mild obstructive sleep apnea.   SURGICAL PROCEDURES:  Tonsillectomy.   SURGEON:  Kinnie Scales. Annalee Genta, MD   ANESTHESIA:  General.   COMPLICATIONS:  None.   BLOOD LOSS:  Minimal.   The patient was transferred from the operating room to recovery room in  stable condition.   BRIEF HISTORY:  The patient is a 35 year old white male who was  evaluated by his primary care physician, Dr. Fermin Schwab.  He had a  history of progressive nighttime snoring and mild sleep apnea.  Inpatient sleep study showed mild apnea.  He was examined and found to  have a very large tonsils with cryptic changes and was referred to our  office for tonsillectomy for management of airway obstruction.  The  risks, benefits, and possible complications of tonsillectomy were  discussed in detail with the patient and his family.  We also discussed  that tonsillectomy alone may not be adequate procedure for sleep apnea.  The patient understood and we proceed with tonsillectomy as the most  straightforward way to manage the patient's ongoing symptoms.  Risks,  benefits, and possible complications were discussed in detail.  The  patient understood and concurred to our plan for surgery which was  scheduled for February 19, 2008, as an outpatient with overnight observation  in the hospital.   PROCEDURE:  Ms.  Castellana was brought to the operating room on February 19, 2008, and placed in supine position on the operating table.  General  endotracheal anesthesia was established without difficulty.  With the  patient adequately anesthetized, his oral cavity and oropharynx were  examined.  There were loose or broken teeth and the hard and soft palate  were intact.  Crowe-Davis mouthgag was inserted without difficulty and  the oral cavity was inspected.  The patient had very large 3+ cryptic  tonsils.  No erythema or exudate.  Tonsillectomy was begun on the left-  hand side using Bovie electrocautery and dissecting the subcapsular  fashion.  The entire left tonsil was removed from the superior pole to  tongue base.  The right tonsil was removed in a similar fashion.  Tonsillar fossa was gently abraded with a dry tonsil sponge and several  small areas of point hemorrhage were cauterized with suction cautery.  An orogastric tube was passed.  Stomach contents were aspirated.  CroweVernelle Emerald was released and reapplied.  No active bleeding.  The  patient then awakened from anesthetic.  He was  extubated and was  transferred from the operating room to the recovery room in stable  condition.  There were no complications.  Blood loss was minimal.           ______________________________  Kinnie Scales. Annalee Genta, M.D.     DLS/MEDQ  D:  44/09/270  T:  02/20/2008  Job:  [id number]

## 2011-02-02 ENCOUNTER — Encounter: Payer: Self-pay | Admitting: Pulmonary Disease

## 2011-02-02 ENCOUNTER — Telehealth: Payer: Self-pay | Admitting: Internal Medicine

## 2011-02-02 NOTE — Telephone Encounter (Addendum)
Walk In Pt Form" Pt Dropped off [employer]  forms" sent to [facility]  02/02/11/km  Dr.Allred Completed [employer] papers,Mailed Original,called pt to pick up Copy,kept copy in drawer. 02/20/11/km  Pt never called back, Mailed Original to [employer] and Copy to Pt  02/23/11/km

## 2011-02-03 NOTE — Op Note (Signed)
NAME:  DESI, ROWE NO.:  [id number]   MEDICAL RECORD NO.:  [id number]                   PATIENT TYPE:  AMB   LOCATION:  DSC                                  FACILITY:  [facility]   PHYSICIAN:  Lowell Bouton, M.D.      DATE OF BIRTH:  04-12-1976   DATE OF PROCEDURE:  05/07/2002  DATE OF DISCHARGE:  05/07/2002                                 OPERATIVE REPORT   PREOPERATIVE DIAGNOSES:  Closed fracture, left fifth proximal phalanx.   POSTOPERATIVE DIAGNOSES:  Closed fracture, left fifth proximal phalanx.   PROCEDURE PERFORMED:  Open reduction and internal fixation of left fifth  proximal phalanx.   SURGEON:  Lowell Bouton, M.D.   ANESTHESIA:  General augmented with 0.5% Marcaine digital block.   OPERATIVE FINDINGS:  The patient had a long spiral fracture of the proximal  phalanx that had 4-5 mm of shortening.   DESCRIPTION OF PROCEDURE:  Under general anesthesia with a tourniquet on the  left arm, the left hand was prepped and draped in the usual sterile fashion.  After exsanguinating the limb, the tourniquet was inflated to 250 mmHg.  A  longitudinal incision was made on the dorsum of the left fifth proximal  phalanx.  Sharp dissection was carried through the subcutaneous tissues and  bleeding points were coagulated.  Sharp dissection was carried through the  extensor tendon longitudinally down to bone, and the periosteum was elevated  around the fracture site.  The fracture site was cleared of any debris and  irrigated out.  The fracture was then reduced and held with an [place] clamp.  The mini fragment set was used to lag two 2.0 screws from ulnar to radial  across the fracture site.  Lagging technique was performed by first drilling  first with the 1.5 drill bit and then overdrilling with the 2.0 drill bit on  the proximal cortex.  The hole was tapped and then 10 mm screws x2 were used  to fix the fracture.  X-rays showed  good alignment and there did not appear  to be any rotational deformity on flexing the digit.  The fracture appeared  to be anatomically aligned under direct vision.  The wound was irrigated  with saline and the periosteum was closed with #4-0 Vicryl suture.  The  extensor tendon was repaired with #4-0 Vicryl running suture and the skin  was closed with a #3-0 subcuticular Prolene.  Steri-Strips were applied  followed by sterile dressings and a dorsal protective splint with the MCP  joints flexed.  The patient tolerated the procedure well and went to the  recovery room awake, in stable and good condition.                                               Tennis Must.  Meyerdierks, M.D.    EMM/MEDQ  D:  05/07/2002  T:  05/09/2002  Job:  [id number]

## 2011-02-03 NOTE — Procedures (Signed)
NAME:  David Gamble, David Gamble NO.:  [id number]   MEDICAL RECORD NO.:  [id number]          PATIENT TYPE:  OUT   LOCATION:  SLEEP CENTER                 FACILITY:  [facility]   PHYSICIAN:  Clinton D. Maple Hudson, MD, FCCP, FACPDATE OF BIRTH:  29-Jan-1975   DATE OF STUDY:                            NOCTURNAL POLYSOMNOGRAM   REFERRING PHYSICIAN:  Onalee Hua L. Annalee Genta, M.D.   INDICATION FOR STUDY:  Hypersomnia with sleep apnea.   EPWORTH SLEEPINESS SCORE:  10/24, BMI 32, weight 249 pounds, height 74  inches, neck 16.5 inches.  A baseline diagnostic study was done on  October 29, 2007 for an AHI Of 24 per hour.  CPAP titration is  requested.   MEDICATIONS:  No home medications are charted.   SLEEP ARCHITECTURE:  Total sleep time 365 minutes with sleep efficiency  86.1%.  Stage I was 5.6%.  Stage II 71.9%.  Stage III 1.6%.  REM 20.8%  of total sleep time.  Sleep latency 17.5 minutes.  REM latency 123.5  minutes.  Awake after sleep onset 42 minutes.  Arousal index 12.5.  No  bedtime medication was taken.   RESPIRATORY DATA:  CPAP titration protocol.  CPAP was titrated to 10  CWP, AHI 0 per hour.  He chose a medium Mirage Quattro full face mask  with heated humidifier.   OXYGEN DATA:  Snoring prevented was CPAP.  Mean oxygen saturation held  95.4% on room air with CPAP.   CARDIAC DATA:  Normal sinus rhythm.   MOVEMENT-PARASOMNIA:  Twelve limb jerks were counted of which only 1 was  associated with sleep disturbance for an index of 0.2 per hour which is  insignificant.   IMPRESSIONS-RECOMMENDATIONS:  1. Successful CPAP titration to 12 CWP, AHI 0 per hour.  He chose a      medium Mirage Quattro full face mask with heated humidifier.  2. Baseline diagnostic NPSG on October 29, 2007 had recorded an AHI      of 24 per hour.      Clinton D. Maple Hudson, MD, [facility], FACP  Diplomate, Biomedical engineer of Sleep Medicine  Electronically Signed    CDY/MEDQ  D:  05/02/2008 13:04:44  T:   05/02/2008 13:31:29  Job:  [id number]

## 2011-02-08 ENCOUNTER — Ambulatory Visit (INDEPENDENT_AMBULATORY_CARE_PROVIDER_SITE_OTHER): Payer: Self-pay | Admitting: Internal Medicine

## 2011-02-08 ENCOUNTER — Encounter: Payer: Self-pay | Admitting: Internal Medicine

## 2011-02-08 ENCOUNTER — Telehealth: Payer: Self-pay | Admitting: Pulmonary Disease

## 2011-02-08 ENCOUNTER — Other Ambulatory Visit: Payer: Self-pay | Admitting: Pulmonary Disease

## 2011-02-08 DIAGNOSIS — R55 Syncope and collapse: Secondary | ICD-10-CM

## 2011-02-08 DIAGNOSIS — G4733 Obstructive sleep apnea (adult) (pediatric): Secondary | ICD-10-CM

## 2011-02-08 NOTE — Telephone Encounter (Signed)
Called and spoke with pt.  Informed him we received his auto download from [employer] and it is in [facility] very important look at folder.  Pt also wanted to talk to Dr. Shelle Iron about Dr. [name] changing pt's out of work date from 3 months to 6 months.  Pt is unsure why Dr. [name] extended this.  Pt states he has had no episodes of "passing out" and his halter monitor came back neg.  Pt would like to know if [facility] will ok pt going back to work.  Pt states he wears his cpap machine every night but not all night because he has to get up multiple times throughout the night to use the bathroom.  Please advise.  Thank you.

## 2011-02-08 NOTE — Telephone Encounter (Signed)
Let him know his optimal cpap pressure is 13cm.  His compliance was 15/15 days, but wore greater than 4 hrs a night only 80% of the time.  Will have dme set his pressure on 13cm, and will get a download in 4weeks to verify compliance.  If has done well, will discuss his driving further.

## 2011-02-08 NOTE — Patient Instructions (Signed)
Your physician recommends that you schedule a follow-up appointment as needed with Dr Allred   

## 2011-02-09 ENCOUNTER — Encounter: Payer: Self-pay | Admitting: Internal Medicine
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
## 2011-02-09 NOTE — Assessment & Plan Note (Signed)
Compliance with CPAP encouraged 
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
## 2011-02-10 NOTE — Telephone Encounter (Signed)
Called and spoke with pt.  Informed him of download results and optimal pressure.  Encouraged pt to increase the hours each night he wears his cpap machine.  Pt verbalized his understanding and stated he will try to increase usage.

## 2011-02-27 ENCOUNTER — Telehealth: Payer: Self-pay | Admitting: Pulmonary Disease

## 2011-02-27 NOTE — Telephone Encounter (Signed)
Called and spoke with pt.  Pt states Dr. [name] has put pt out of work x 6 months. Pt is frustrated with this and is wanting KC to help him with getting back to work.  Informed pt, per last phone note from 5/23 that [facility] had ordered pt's cpap machine to be set on 13cm and then have a 4 week compliance download.  Pt states he is going to see his DME today to get pressure set at 13cm.  Therefore I scheduled pt a 4 week f/u appt for 7/9 at 9 am and asked pt to bring in cpap machine and cord so we can get download at that time and discuss pt's working status.  Pt verbalized understanding.

## 2011-02-28 ENCOUNTER — Telehealth: Payer: Self-pay | Admitting: Pulmonary Disease

## 2011-02-28 NOTE — Telephone Encounter (Signed)
Spoke with pt and notified that [facility] out of the office until next wk. Will forward him msg to let him know he is requesting these results.

## 2011-03-06 NOTE — Telephone Encounter (Signed)
Have not seen this download  Megan, have you seen this.  If not, check with [employer].

## 2011-03-07 NOTE — Telephone Encounter (Signed)
Christoper Allegra called back and stated the download pt is talking about is the old auto download that we received back in May.  They haven't gotten a current download on pt.  Called pt back and informed him of this and also informed him of the 5/23 phone note stating his machine was to be set on 13cm and to then get another download 4 weeks after set pressure to check on compliance. Pt states he took his machine to [employer] yesterday and states he believes they set his machine on 13cm.    Pt also calling about his requip.  Pt states while taking requip it wasn't helping his restless legs and therefore quit taking it. Pt states he has difficulty sleeping- c/o "tossing and turning all night."  Pt is requesting something to help him with this so that he can be more compliant with his cpap usage.  Pt states he did take one of his mom's xanax and this did help him relax and sleep better.  Pt is requesting KC's recs.  Please advise.  Thanks.

## 2011-03-07 NOTE — Telephone Encounter (Signed)
Called and spoke with Marcelino Duster from [employer] and requested download be faxed to triage fax.  Awaiting fax from [employer].

## 2011-03-08 MED ORDER — TRAZODONE HCL 50 MG PO TABS: 50.0000 mg | ORAL_TABLET | Freq: Every day | ORAL | Status: DC

## 2011-03-08 NOTE — Telephone Encounter (Signed)
Will need to get a download in 4 weeks while on cpap 13, then can make an assessment of his driving. Can try trazodone 50mg , one at bedtime  #30, no fills.

## 2011-03-08 NOTE — Telephone Encounter (Signed)
Called and spoke with pt and he is aware per [facility] that we will need another download from his cpap in 4 weeks from [employer] and he will try the trazodone 50mg  one at bedtime and this has been sent to his pharmacy and pt is aware. Pt is aware that [facility] will be able to do an assessment of his driving after this next download.

## 2011-03-09 ENCOUNTER — Telehealth: Payer: Self-pay | Admitting: Pulmonary Disease

## 2011-03-09 DIAGNOSIS — G4733 Obstructive sleep apnea (adult) (pediatric): Secondary | ICD-10-CM

## 2011-03-09 NOTE — Telephone Encounter (Signed)
I explained to pt per phone note from yesterday that [facility] wants a download in 4 weeks on the setting of 13. I have sent an order to [employer] to do download in 4 weeks. Pt aware nothing needed at this time. Also advised that [employer] will contact him to get download in 4 weeks. Carron Curie, CMA

## 2011-03-23 ENCOUNTER — Telehealth: Payer: Self-pay | Admitting: Pulmonary Disease

## 2011-03-23 NOTE — Telephone Encounter (Signed)
Why dont we find out how long he has been on optimal pressure.  He can then f/u with me at 4 week mark, and get him to bring machine to office and we can download.

## 2011-03-23 NOTE — Telephone Encounter (Signed)
Called and spoke with pt.  Pt is scheduled to see [facility] on Monday 7/9 for cpap download.  Per Kc's instructions from previously, pt was to be set on cpap of 13cm and then a 4 week download was to be sent to [place] from DME company.  Pt states he thinks he has been on this set pressure of 13cm for approx 3 weeks but is unsure.  Wanted to know if he should keep this appt with [facility] to get download or cancel appt and wait until [facility] get's download from DME company.  KC, please advise.

## 2011-03-24 NOTE — Telephone Encounter (Signed)
Called and spoke with Ramer from [facility].  She states per their records, pt has been on a fixed pressure of 13 since 6/30.  Therefore will reschedule pt's appt.    Called and spoke with pt.  Informed him of the above information. Pt verbalized understanding.  Rescheduled appt for 8/1 at 9am. Pt is aware to bring cpap machine and cord with him to visit so we can get download.

## 2011-03-27 ENCOUNTER — Ambulatory Visit: Payer: Self-pay | Admitting: Pulmonary Disease

## 2011-04-19 ENCOUNTER — Encounter: Payer: Self-pay | Admitting: Pulmonary Disease

## 2011-04-19 ENCOUNTER — Ambulatory Visit (INDEPENDENT_AMBULATORY_CARE_PROVIDER_SITE_OTHER): Payer: 59 | Admitting: Pulmonary Disease

## 2011-04-19 ENCOUNTER — Telehealth: Payer: Self-pay | Admitting: Internal Medicine

## 2011-04-19 DIAGNOSIS — G4733 Obstructive sleep apnea (adult) (pediatric): Secondary | ICD-10-CM

## 2011-04-19 DIAGNOSIS — G2581 Restless legs syndrome: Secondary | ICD-10-CM

## 2011-04-19 MED ORDER — ROPINIROLE HCL 1 MG PO TABS: ORAL_TABLET | ORAL | Status: DC

## 2011-04-19 NOTE — Patient Instructions (Signed)
Continue with cpap, you are doing better. Work on [employer] loss Get back on requip 1mg  after dinner each night, then call me in 4 weeks with your progress.  Is it helping your sleep?

## 2011-04-19 NOTE — Progress Notes (Signed)
  Subjective:    Patient ID: David Gamble, male    DOB: 1976/08/30, 35 y.o.   MRN: [id number]  HPI The pt comes in today for f/u of his known moderate osa.  He has been trying to wear cpap more compliantly, and download today does show 67% compliance of greater than/equal to 4hrs of use.  He is still having awakenings during the night for unknown reasons, and struggles at times with tolerating mask on his face.  He has classic RLS symptoms by history, but he is not taking the requip that was prescribed.  He denies any significant sleepiness during the day with periods of inactivity though.   Review of Systems  Constitutional: Negative for fever and unexpected weight change.  HENT: Negative for ear pain, nosebleeds, congestion, sore throat, rhinorrhea, sneezing, trouble swallowing, dental problem, postnasal drip and sinus pressure.   Eyes: Negative for redness and itching.  Respiratory: Negative for cough, chest tightness, shortness of breath and wheezing.   Cardiovascular: Negative for palpitations and leg swelling.  Gastrointestinal: Negative for nausea and vomiting.  Genitourinary: Negative for dysuria.  Musculoskeletal: Negative for joint swelling.  Skin: Negative for rash.  Neurological: Negative for headaches.  Hematological: Does not bruise/bleed easily.  Psychiatric/Behavioral: Negative for dysphoric mood. The patient is not nervous/anxious.        Objective:   Physical Exam Ow male in nad No skin breakdown or pressure necrosis from cpap mask.  No purulence or drainage noted. Cor with rrr LE without edema, no cyanosis noted.  Alert, does not appear sleepy, moves all 4        Assessment & Plan:

## 2011-04-19 NOTE — Telephone Encounter (Signed)
Pt called and wants to know if he can go back to work.  Ok with Dr. Shelle Iron but needs ok from Dr. [name]. Please give him a call regarding same.

## 2011-04-19 NOTE — Telephone Encounter (Signed)
Drives a truck.  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  He has not had another episode since  Really wants to go back to work  I told him I would discuss with Dr [name] tomorrow

## 2011-04-19 NOTE — Assessment & Plan Note (Addendum)
The pt continues to have issues with cpap tolerance due to presence of a mask on his face, but thinks this is getting better.  His compliance is improved, but still needs to get better.  He is continuing to have awakenings at night, but is not taking his dopamine agonist that was prescribed for his RLS symptoms in the evening.  This may be the reason for his sleep onset issues and his awakenings?  Will restart him on requip to see if it will help.  If he continues to have sleep onset and maintenance issues, would consider a trial of trazodone to see if it will help with insomnia.  I have spent 29 min with pt today discussing the above and answering the pt's and his mother's questions.

## 2011-04-24 NOTE — Telephone Encounter (Signed)
Patient calling back to speak with nurse.

## 2011-04-24 NOTE — Telephone Encounter (Signed)
Spoke with patient and let him know that I have forwarded this to Dr [name] but that he is out this week and it may be early next week before we get back to him

## 2011-05-01 NOTE — Telephone Encounter (Signed)
Per pt call, pt calling back to speak w/ Tresa Endo. Pt said he needs to let pt short term disability know if he can return to work. Pt said today is the last he is approved short term disability. Please return pt call to advise/discuss.
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
## 2011-05-01 NOTE — Telephone Encounter (Signed)
Patient aware Dr [name] back and will call him

## 2011-05-19 ENCOUNTER — Telehealth: Payer: Self-pay | Admitting: Pulmonary Disease

## 2011-05-19 NOTE — Telephone Encounter (Signed)
Called and spoke with pt.  Pt last saw KC on 8/1 and was told to restart on requip 1mg .  Pt states he takes his between 5 and 7pm each night and then goes to bed at 10pm.  Pt states he is still waking up approx 2 x per night.   Pt states when he wears his cpap mask he has difficulty falling asleep.  States it can take him 30 to 45 mins and then he gets "frustrated" and takes it off.  He will then fall asleep without the machine and when he wakes up in the middle of the night he will then put the cpap  on and will fall back asleep within 10 to 15 mins.  Pt states he has 4 pills left of Requip and is requesting KC's recs.  Pt is aware KC out of office until next Tuesday and he is ok to wait until [facility] returns next week to address message.

## 2011-05-23 MED ORDER — TRAZODONE HCL 50 MG PO TABS: 50.0000 mg | ORAL_TABLET | Freq: Every day | ORAL | Status: DC

## 2011-05-23 NOTE — Telephone Encounter (Signed)
Spoke with pt and notified of recs per [facility]. Pt verbalized understanding. Rx for trazodone was sent to pharm

## 2011-05-23 NOTE — Telephone Encounter (Signed)
Let him know he is to continue on the requip after dinner. Will try trazodone 50mg  one at bedtime, but let him know taking 30-59min to fall asleep is not that abnormal.  He can use ramp button on his machine to let the pressure start out low, to allow him to fall asleep.  He is to NOT take trazodone if he is not planning on wearing cpap Also let him know he needs to give this his best effort, since it is really the best treatment for him.    Call in trazodone 50mg  one at bedtime, #30, no fills.  He is to call me in 4 weeks with his response.

## 2011-05-23 NOTE — Telephone Encounter (Signed)
PATIENT RETURNED CALL PLEASE CALL BACK

## 2011-05-23 NOTE — Telephone Encounter (Signed)
lmtcb

## 2011-06-15 LAB — BASIC METABOLIC PANEL
CO2: 31
Chloride: 103
GFR calc Af Amer: >60
Potassium: 4.6
Sodium: 139

## 2011-06-15 LAB — CBC
Hemoglobin: 16.5
MCHC: 34.2
MCV: 85.9
RBC: 5.61

## 2011-06-22 ENCOUNTER — Other Ambulatory Visit: Payer: Self-pay | Admitting: Pulmonary Disease

## 2011-06-22 ENCOUNTER — Telehealth: Payer: Self-pay | Admitting: Pulmonary Disease

## 2011-06-22 NOTE — Telephone Encounter (Signed)
Called and spoke with pt.  Pt calling regarding Trazodone.  Pt was given a trial of this per phone note on 05/19/11.  Pt states he is doing well on Trazodone.  Denies any difficulty falling or staying asleep. He is wearing his cpap every night and is also taking Requip every night.  Pt denies any "hangover" effect the next morning.  Pt states he has 2 tabs left of the Trazodone and is worried if he comes off the med that he will have difficulty falling and staying asleep again. Pt is requesting KC's recs. Please advise.  Thanks.

## 2011-06-23 MED ORDER — TRAZODONE HCL 50 MG PO TABS: 50.0000 mg | ORAL_TABLET | ORAL | Status: DC

## 2011-06-23 NOTE — Telephone Encounter (Signed)
I informed pt of KC's findings and recommendations. Pt verbalized understanding. Refill sent to pharmacy. Julaine Hua, CMA

## 2011-06-23 NOTE — Telephone Encounter (Signed)
I am ok with giving him one more month of treatment, i.e. #30.  But I would like for him to start weaning himself off the medication if able.  Try taking one every other day and see how he does, then spread out further.

## 2011-07-03 ENCOUNTER — Other Ambulatory Visit: Payer: Self-pay | Admitting: *Deleted

## 2011-07-03 DIAGNOSIS — G2581 Restless legs syndrome: Secondary | ICD-10-CM

## 2011-07-03 MED ORDER — ROPINIROLE HCL 1 MG PO TABS: ORAL_TABLET | ORAL | Status: DC

## 2011-07-06 ENCOUNTER — Telehealth: Payer: Self-pay | Admitting: Internal Medicine

## 2011-07-06 NOTE — Telephone Encounter (Signed)
David Gamble is requesting a note from Dr [name] to release him to return to work.  He thinks this is the week he was supposed to return.  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  He knows that Dr [name] is not in the office today.

## 2011-07-06 NOTE — Telephone Encounter (Signed)
Pt called. He is supposed to be released to go back to work. He wants to know what he needs to do. Please call

## 2011-07-10 ENCOUNTER — Other Ambulatory Visit: Payer: Self-pay | Admitting: *Deleted

## 2011-07-10 DIAGNOSIS — G2581 Restless legs syndrome: Secondary | ICD-10-CM

## 2011-07-10 MED ORDER — ROPINIROLE HCL 1 MG PO TABS: ORAL_TABLET | ORAL | Status: DC

## 2011-07-10 NOTE — Telephone Encounter (Signed)
Pt returning your call please call °

## 2011-07-12 NOTE — Telephone Encounter (Signed)
Okay to drive  He is 6 months out and has had no further syncope

## 2011-07-12 NOTE — Telephone Encounter (Signed)
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

## 2011-07-21 ENCOUNTER — Telehealth: Payer: Self-pay | Admitting: Pulmonary Disease

## 2011-07-21 MED ORDER — PRAMIPEXOLE DIHYDROCHLORIDE 0.25 MG PO TABS: 0.2500 mg | ORAL_TABLET | Freq: Every day | ORAL | Status: DC

## 2011-07-21 NOTE — Telephone Encounter (Signed)
Let him know it is unlikely requip is causing eye pain.  Perhaps he should see eye doctor or primary about this.  If he is dead set on coming off requip, can call in mirapex 0.25mg  one each night.  #30, one fill.

## 2011-07-21 NOTE — Telephone Encounter (Signed)
Called and spoke with pt. He states that he has tapered off of the trazodone and has been having some issues with sleeping well since. He states that he takes requip around 8 pm and plans to go to sleep around 10 pm, but shortly after taking med his eyes start to hurt.  He states that this is something new that has just started bothering him. He did not sleep well last night and tossed and turned all night. He states that he woke up 7-8 times. Wants to know if there is alternative to requip that could relax him enough to sleep better and not bother his eyes. Please advise, thanks!

## 2011-07-21 NOTE — Telephone Encounter (Signed)
Pt wants to try Mirapex and stop Requip. I will call rx to [facility] in [place] on [address]. Pt will call if he continues to have problems.

## 2011-07-24 ENCOUNTER — Telehealth: Payer: Self-pay | Admitting: Internal Medicine

## 2011-07-24 NOTE — Telephone Encounter (Addendum)
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

## 2011-07-28 ENCOUNTER — Other Ambulatory Visit: Payer: Self-pay | Admitting: Pulmonary Disease

## 2011-07-28 DIAGNOSIS — G4733 Obstructive sleep apnea (adult) (pediatric): Secondary | ICD-10-CM

## 2011-08-14 ENCOUNTER — Telehealth: Payer: Self-pay | Admitting: Internal Medicine

## 2011-08-14 ENCOUNTER — Telehealth: Payer: Self-pay | Admitting: Pulmonary Disease

## 2011-08-14 NOTE — Telephone Encounter (Signed)
New Msg: Pt calling stating that he needs a written note releasing pt to go back to work. Please return pt call to discuss further.

## 2011-08-14 NOTE — Telephone Encounter (Signed)
I spoke with David Gamble and she states that pt needs OV with [facility] to discuss his download. Pt is aware of this and is scheduled to see [facility] 08/15/11 at 11:00.

## 2011-08-15 ENCOUNTER — Ambulatory Visit (INDEPENDENT_AMBULATORY_CARE_PROVIDER_SITE_OTHER): Payer: 59 | Admitting: Pulmonary Disease

## 2011-08-15 ENCOUNTER — Telehealth: Payer: Self-pay | Admitting: Internal Medicine

## 2011-08-15 ENCOUNTER — Encounter: Payer: Self-pay | Admitting: Pulmonary Disease

## 2011-08-15 VITALS — BP 138/78 | HR 59 | Temp 98.4°F | Ht 74.0 in | Wt 210.0 lb

## 2011-08-15 DIAGNOSIS — G4733 Obstructive sleep apnea (adult) (pediatric): Secondary | ICD-10-CM

## 2011-08-15 NOTE — Patient Instructions (Signed)
Stop cpap. Will refer for dental appliance.  Let me know how that goes.  Talk with employer about options for work, and let me know if I can help with note.

## 2011-08-15 NOTE — Telephone Encounter (Signed)
Pt Needs Back to Work Note ASAP. Please call When Ready @ [phone]

## 2011-08-15 NOTE — Progress Notes (Signed)
  Subjective:    Patient ID: David Gamble, male    DOB: 08-08-1976, 35 y.o.   MRN: [id number]  HPI The patient comes in today for followup of his moderate obstructive sleep apnea.  He has continued to have significant difficulty with CPAP compliance, and his most recent download shows very little use of the device on a consistent basis.  The patient states that he just cannot tolerate having the mask on his face despite [place] trying many different things.  He really feels this treatment modality is not going to work.   Review of Systems  Constitutional: Negative for fever and unexpected weight change.  HENT: Negative for ear pain, nosebleeds, congestion, sore throat, rhinorrhea, sneezing, trouble swallowing, dental problem, postnasal drip and sinus pressure.   Eyes: Negative for redness and itching.  Respiratory: Negative for cough, chest tightness, shortness of breath and wheezing.   Cardiovascular: Negative for palpitations and leg swelling.  Gastrointestinal: Negative for nausea and vomiting.  Genitourinary: Negative for dysuria.  Musculoskeletal: Negative for joint swelling.  Skin: Negative for rash.  Neurological: Negative for headaches.  Hematological: Does not bruise/bleed easily.  Psychiatric/Behavioral: Negative for dysphoric mood. The patient is not nervous/anxious.        Objective:   Physical Exam Well-developed male in no acute distress  no skin breakdown or pressure necrosis from the CPAP mask Chest clear to auscultation Cardiac with regular rate and rhythm Lower extremities without edema, no cyanosis noted Alert, does not appear to be sleepy, moves all 4 extremities.       Assessment & Plan:

## 2011-08-15 NOTE — Telephone Encounter (Signed)
Dr [name] will need to write letter stating that he can drive and go back to work as previous

## 2011-08-15 NOTE — Assessment & Plan Note (Signed)
The patient has moderate obstructive sleep apnea, but is completely intolerant of CPAP.  I really feel this is not a viable therapy for him.  I have discussed upper airway surgery, as well as a dental appliance as alternative treatments.  I believe he may have a good response to a dental appliance if he is willing to wear it.  With respect to a note which allows him to return to work, I am not able to do this since he is not currently undergoing treatment for his sleep apnea.  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  I have asked him to talk with his employer about returning to work not involving the operation of a motorized vehicle.  I would be happy to write a note for him stating that we are currently looking at other modalities for treatment of his sleep apnea.

## 2011-08-17 ENCOUNTER — Telehealth: Payer: Self-pay | Admitting: Internal Medicine

## 2011-08-17 NOTE — Telephone Encounter (Signed)
Spoke with patient  David Gamble in pulmonary has his note for work  He is going to call them

## 2011-08-17 NOTE — Telephone Encounter (Signed)
F/u pt stated he had called 3 time for a note to return to work.  This is very Urgent that he gets this as soon as possible.  Please call and advise if he can pick up today before closing.

## 2011-08-18 ENCOUNTER — Encounter: Payer: Self-pay | Admitting: *Deleted

## 2011-08-25 ENCOUNTER — Encounter: Payer: Self-pay | Admitting: Pulmonary Disease

## 2011-09-21 ENCOUNTER — Telehealth: Payer: Self-pay | Admitting: Pulmonary Disease

## 2011-09-21 NOTE — Telephone Encounter (Signed)
Called and spoke with pt. He is requesting a refill on mirapex, states that this has really been helping. I noticed that [facility] had only okayed one refill before, so want to be sure this is okay to refill again. Pt states that he still has 4 tablets left so okay to wait until tomorrow for response. Please advise, thanks!

## 2011-09-22 NOTE — Telephone Encounter (Signed)
Ok to refill mirapex with additional 5 fills.

## 2011-09-25 MED ORDER — PRAMIPEXOLE DIHYDROCHLORIDE 0.25 MG PO TABS: 0.2500 mg | ORAL_TABLET | Freq: Every day | ORAL | Status: DC

## 2011-09-25 NOTE — Telephone Encounter (Signed)
Pt returning call can be reached at [phone].David Gamble

## 2011-09-25 NOTE — Telephone Encounter (Signed)
lmomtcb x1 

## 2011-09-25 NOTE — Telephone Encounter (Signed)
Rx has been sent and LMOMTCB to make pt aware

## 2011-09-26 NOTE — Telephone Encounter (Signed)
Spoke with pt and notified that the rx for mirapex was sent. Pt verbalized understanding and states nothing further needed.

## 2012-01-16 ENCOUNTER — Encounter (HOSPITAL_COMMUNITY): Payer: Self-pay | Admitting: *Deleted

## 2012-01-16 ENCOUNTER — Emergency Department (HOSPITAL_COMMUNITY)
Admission: EM | Admit: 2012-01-16 | Discharge: 2012-01-16 | Disposition: A | Discharge status: Home / Self Care | Payer: 59 | Attending: Emergency Medicine | Admitting: Emergency Medicine

## 2012-01-16 DIAGNOSIS — IMO0002 Reserved for concepts with insufficient information to code with codable children: Secondary | ICD-10-CM

## 2012-01-16 DIAGNOSIS — S0180XA Unspecified open wound of other part of head, initial encounter: Secondary | ICD-10-CM | POA: Insufficient documentation

## 2012-01-16 DIAGNOSIS — G4730 Sleep apnea, unspecified: Secondary | ICD-10-CM | POA: Insufficient documentation

## 2012-01-16 DIAGNOSIS — W219XXA Striking against or struck by unspecified sports equipment, initial encounter: Secondary | ICD-10-CM | POA: Insufficient documentation

## 2012-01-16 NOTE — ED Provider Notes (Signed)
Medical screening examination/treatment/procedure(s) were performed by non-physician practitioner and as supervising physician I was immediately available for consultation/collaboration.   Elson Ulbrich, MD 01/16/12 2306 

## 2012-01-16 NOTE — Discharge Instructions (Signed)
Laceration Care, Adult A laceration is a cut that goes through all layers of the skin. The cut goes into the tissue beneath the skin. HOME CARE For stitches (sutures) or staples:  Keep the cut clean and dry.   If you have a bandage (dressing), change it at least once a day. Change the bandage if it gets wet or dirty, or as told by your doctor.   Wash the cut with soap and water 2 times a day. Rinse the cut with water. Pat it dry with a clean towel.   Put a thin layer of medicated cream on the cut as told by your doctor.   You may shower after the first 24 hours. Do not soak the cut in water until the stitches are removed.   Only take medicines as told by your doctor.   Have your stitches or staples removed as told by your doctor.  For skin adhesive strips:  Keep the cut clean and dry.   Do not get the strips wet. You may take a bath, but be careful to keep the cut dry.   If the cut gets wet, pat it dry with a clean towel.   The strips will fall off on their own. Do not remove the strips that are still stuck to the cut.  For wound glue:  You may shower or take baths. Do not soak or scrub the cut. Do not swim. Avoid heavy sweating until the glue falls off on its own. After a shower or bath, pat the cut dry with a clean towel.   Do not put medicine on your cut until the glue falls off.   If you have a bandage, do not put tape over the glue.   Avoid lots of sunlight or tanning lamps until the glue falls off. Put sunscreen on the cut for the first year to reduce your scar.   The glue will fall off on its own. Do not pick at the glue.  You may need a tetanus shot if:  You cannot remember when you had your last tetanus shot.   You have never had a tetanus shot.  If you need a tetanus shot and you choose not to have one, you may get tetanus. Sickness from tetanus can be serious. GET HELP RIGHT AWAY IF:   Your pain does not get better with medicine.   Your arm, hand, leg, or  foot loses feeling (numbness) or changes color.   Your cut is bleeding.   Your joint feels weak, or you cannot use your joint.   You have painful lumps on your body.   Your cut is red, puffy (swollen), or painful.   You have a red line on the skin near the cut.   You have yellowish-white fluid (pus) coming from the cut.   You have a fever.   You have a bad smell coming from the cut or bandage.   Your cut breaks open before or after stitches are removed.   You notice something coming out of the cut, such as wood or glass.   You cannot move a finger or toe.  MAKE SURE YOU:   Understand these instructions.   Will watch your condition.   Will get help right away if you are not doing well or get worse.  Document Released: 02/21/2008 Document Revised: 08/24/2011 Document Reviewed: 02/28/2011 [facility] Patient Information 2012 [place], [place]. Discussed wash the area with soap and water once per day.  Try to  keep it as clean and dry as possible.  Any topical antibiotic ointment one to two times daily.  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

## 2012-01-16 NOTE — ED Notes (Signed)
Pt was playing basketball, was elbowed above right eye.  Bleeding controlled.

## 2012-01-16 NOTE — ED Provider Notes (Signed)
History     CSN: [id number]  Arrival date & time 01/16/12  2107   None     Chief Complaint  Patient presents with  . Facial Laceration    (Consider location/radiation/quality/duration/timing/severity/associated sxs/prior treatment) HPI Comments: 9 basketball, when he was hit above the right eye with an elbow during a game.  No loss of consciousness.  No dizziness.  No visual disturbances.  No nausea  The history is provided by the patient.    Past Medical History  Diagnosis Date  . Tonsillar hypertrophy   . Sleep apnea     uses CPAP, followed by Dr Shelle Iron  . Overweight   . Heart burn   . Hemorrhoids   . RLS (restless legs syndrome)     Past Surgical History  Procedure Date  . Tonsillectomy 2009  . Finger surgery 2005    ORIF of 5th digit L hand    Family History  Problem Relation Age of Onset  . COPD    . Lung cancer    . Cirrhosis    . Hypertension Mother     History  Substance Use Topics  . Smoking status: Never Smoker   . Smokeless tobacco: Never Used  . Alcohol Use: No      Review of Systems  HENT: Negative for neck pain.   Skin: Positive for wound.  Neurological: Negative for dizziness, weakness and headaches.    Allergies  Review of patient's allergies indicates no known allergies.  Home Medications  No current outpatient prescriptions on file.  BP 129/75  Pulse 81  Temp(Src) 98.3 F (36.8 C) (Oral)  Resp 20  SpO2 96%  Physical Exam  Constitutional: He is oriented to person, place, and time. He appears well-developed and well-nourished.  HENT:  Head: Normocephalic.  Right Ear: External ear normal.  Left Ear: External ear normal.  Nose: Nose normal.       1 cm laceration just above the medial right eyebrow.  No active bleeding  Eyes: Pupils are equal, round, and reactive to light.  Neck: Normal range of motion.  Cardiovascular: Normal rate.   Pulmonary/Chest: Effort normal.  Neurological: He is alert and oriented to person,  place, and time.  Skin: Skin is warm.    ED Course  LACERATION REPAIR Date/Time: 01/16/2012 11:04 PM Performed by: Arman Filter Authorized by: Arman Filter Consent: Verbal consent obtained. Risks and benefits: risks, benefits and alternatives were discussed Consent given by: patient Patient understanding: patient states understanding of the procedure being performed Body area: head/neck Location details: forehead Laceration length: 1 cm Tendon involvement: none Nerve involvement: none Vascular damage: no Anesthesia: local infiltration Local anesthetic: lidocaine 1% without epinephrine Patient sedated: no Preparation: Patient was prepped and draped in the usual sterile fashion. Irrigation solution: saline Irrigation method: syringe Amount of cleaning: standard Debridement: none Degree of undermining: none Skin closure: 6-0 Prolene Number of sutures: 4 Technique: simple Approximation: close Approximation difficulty: simple Dressing: antibiotic ointment Patient tolerance: Patient tolerated the procedure well with no immediate complications.   (including critical care time)  Labs Reviewed - No data to display No results found.   1. Laceration       MDM  Forehead laceration        Arman Filter, NP 01/16/12 2305

## 2012-02-26 ENCOUNTER — Telehealth: Payer: Self-pay | Admitting: Pulmonary Disease

## 2012-02-26 NOTE — Telephone Encounter (Signed)
I spoke with pt and he needed to be scheduled to see SN for follow up. Pt scheduled for 03/28/12.

## 2012-03-01 ENCOUNTER — Emergency Department (HOSPITAL_COMMUNITY)
Admission: EM | Admit: 2012-03-01 | Discharge: 2012-03-01 | Disposition: A | Discharge status: Home / Self Care | Payer: Worker's Compensation | Attending: Emergency Medicine | Admitting: Emergency Medicine

## 2012-03-01 ENCOUNTER — Encounter (HOSPITAL_COMMUNITY): Payer: Self-pay | Admitting: Emergency Medicine

## 2012-03-01 DIAGNOSIS — W268XXA Contact with other sharp object(s), not elsewhere classified, initial encounter: Secondary | ICD-10-CM | POA: Insufficient documentation

## 2012-03-01 DIAGNOSIS — Z23 Encounter for immunization: Secondary | ICD-10-CM | POA: Insufficient documentation

## 2012-03-01 DIAGNOSIS — S61512A Laceration without foreign body of left wrist, initial encounter: Secondary | ICD-10-CM

## 2012-03-01 DIAGNOSIS — S61509A Unspecified open wound of unspecified wrist, initial encounter: Secondary | ICD-10-CM | POA: Insufficient documentation

## 2012-03-01 DIAGNOSIS — Y990 Civilian activity done for income or pay: Secondary | ICD-10-CM | POA: Insufficient documentation

## 2012-03-01 MED ORDER — TETANUS-DIPHTH-ACELL PERTUSSIS 5-2.5-18.5 LF-MCG/0.5 IM SUSP
0.5000 mL | Freq: Once | INTRAMUSCULAR | Status: AC
  Administered 2012-03-01: 0.5 mL via INTRAMUSCULAR
  Filled 2012-03-01: qty 0.5

## 2012-03-01 NOTE — ED Provider Notes (Signed)
History     CSN: [id number]  Arrival date & time 03/01/12  0831   First MD Initiated Contact with Patient 03/01/12 0848     09:44 AM HPI Patient reports he was at work when a piece of sheet metal lacerated his wrist. Reports currently bleeding is controlled. Wound is tender to palpation patient denies numbness, tingling, weakness, difficulty using left hand.  Patient is a 36 y.o. male presenting with skin laceration. The history is provided by the patient.  Laceration  The incident occurred 1 to 2 hours ago. Pain location: Left wrist. The laceration is 3 cm in size. The laceration mechanism was a a metal edge. The pain is mild. The pain has been constant since onset. His tetanus status is UTD (3 months ago).    Past Medical History  Diagnosis Date  . Tonsillar hypertrophy   . Sleep apnea     uses CPAP, followed by Dr Shelle Iron  . Overweight   . Heart burn   . Hemorrhoids   . RLS (restless legs syndrome)     Past Surgical History  Procedure Date  . Tonsillectomy 2009  . Finger surgery 2005    ORIF of 5th digit L hand    Family History  Problem Relation Age of Onset  . COPD    . Lung cancer    . Cirrhosis    . Hypertension Mother     History  Substance Use Topics  . Smoking status: Never Smoker   . Smokeless tobacco: Never Used  . Alcohol Use: No      Review of Systems  Constitutional: Negative for fever.  Skin: Positive for wound (laceration). Negative for color change.  All other systems reviewed and are negative.    Allergies  Review of patient's allergies indicates no known allergies.  Home Medications  No current outpatient prescriptions on file.  BP 159/83  Pulse 61  Temp 97.7 F (36.5 C) (Oral)  Resp 18  SpO2 97%  Physical Exam  Constitutional: He is oriented to person, place, and time. He appears well-developed and well-nourished.  HENT:  Head: Normocephalic and atraumatic.  Eyes: Pupils are equal, round, and reactive to light.    Musculoskeletal:       Left anterior wrist: Patient has a 2 cm flap laceration. Currently hemostatic. Wound is superficial and does not appear to lacerate tendons or muscle. Patient has full range of motion of her wrists, fingers, hands, normal distal pulses.  Neurological: He is alert and oriented to person, place, and time.  Skin: Skin is warm and dry. No rash noted. No erythema. No pallor.  Psychiatric: He has a normal mood and affect. His behavior is normal.    ED Course  Procedures  LACERATION REPAIR Performed by: Thomasene Lot Authorized by: Thomasene Lot Consent: Verbal consent obtained. Risks and benefits: risks, benefits and alternatives were discussed Consent given by: patient Patient identity confirmed: provided demographic data Prepped and Draped in normal sterile fashion Wound explored  Laceration Location: Anterior left wrist  Laceration Length: 3 cm  No Foreign Bodies seen or palpated  Anesthesia: local infiltration  Local anesthetic: lidocaine 2 % with epinephrine  Anesthetic total: 4 ml  Irrigation method: syringe Amount of cleaning: standard  Skin closure: Suture   Number of sutures: 6   Technique: Simple interrupted with 1 horizontal mattress   Patient tolerance: Patient tolerated the procedure well with no immediate complications.  MDM   Patient still is pending urine drug screen for on-the-job injury.  Thomasene Lot, PA-C 03/01/12 1120

## 2012-03-01 NOTE — ED Notes (Signed)
Left   Wrist lac ~ 1 inch cut on piece of metal last tetanus was unknown done while working

## 2012-03-01 NOTE — Discharge Instructions (Signed)

## 2012-03-01 NOTE — ED Notes (Signed)
Pt with laceration to left wrist from metal while at work; bleeding controlled; CMS intact

## 2012-03-01 NOTE — ED Provider Notes (Signed)
Medical screening examination/treatment/procedure(s) were performed by non-physician practitioner and as supervising physician I was immediately available for consultation/collaboration.   Jonique Kulig, MD 03/01/12 1643 

## 2012-03-15 ENCOUNTER — Emergency Department (INDEPENDENT_AMBULATORY_CARE_PROVIDER_SITE_OTHER)
Admission: EM | Admit: 2012-03-15 | Discharge: 2012-03-15 | Disposition: A | Discharge status: Home / Self Care | Payer: Worker's Compensation | Source: Home / Self Care | Attending: Emergency Medicine | Admitting: Emergency Medicine

## 2012-03-15 ENCOUNTER — Encounter (HOSPITAL_COMMUNITY): Payer: Self-pay

## 2012-03-15 DIAGNOSIS — IMO0002 Reserved for concepts with insufficient information to code with codable children: Secondary | ICD-10-CM

## 2012-03-15 DIAGNOSIS — Z4802 Encounter for removal of sutures: Secondary | ICD-10-CM

## 2012-03-15 NOTE — ED Notes (Signed)
Seen 6-14 for laceration to wrist ; here for suture removal

## 2012-03-15 NOTE — Discharge Instructions (Signed)
  Suture Removal You have had your sutures (stitches) removed today. This means your wound has healed well enough to take out your stitches. Be careful to protect the wound area over the next several weeks. An injury this area could cause the cut to split open again. It usually takes 1-2 years for a scar to get its full strength and loose its redness. For wounds that heal slowly, tapes may be applied to reinforce the skin for several days after the stitches are removed. You may allow the sutured area to get wet. Topical antibiotics (antibiotics you put on your skin) are not usually needed at this point. Applying vitamin E oil and aloe vera ointments may help the wound heal faster and stronger. Some scars form extra pigment with exposure to sunlight during the first 6-12 months after repair. This can be prevented by using a sun block (SPF 15-30) on the affected area. Call your doctor if you have any concerns about your injury. Call right away if you have any evidence of wound infection such as increased pain, drainage, redness, or swelling. Document Released: 10/12/2004 Document Revised: 08/24/2011 Document Reviewed: 06/26/2008 [facility] Patient Information 2012 [place], [place]. Scar Minimization You will have a scar anytime you have surgery and a cut is made in the skin or you have something removed from your skin (mole, skin cancer, cyst). Although scars are unavoidable following surgery, there are ways to minimize their appearance. It is important to follow all the instructions you receive from your caregiver about wound care. How your wound heals will influence the appearance of your scar. If you do not follow the wound care instructions as directed, complications such as infection may occur. Wound instructions include keeping the wound clean, moist, and not letting the wound form a scab. Some people form scars that are raised and lumpy (hypertrophic) or larger than the initial wound (keloidal). HOME CARE  INSTRUCTIONS   Follow wound care instructions as directed.   Keep the wound clean by washing it with soap and water.   Keep the wound moist with provided antibiotic cream or petroleum jelly until completely healed. Moisten twice a day for about 2 weeks.   Get stitches (sutures) taken out at the scheduled time.   Avoid touching or manipulating your wound unless needed. Wash your hands thoroughly before and after touching your wound.   Follow all restrictions such as limits on exercise or work. This depends on where your scar is located.   Keep the scar protected from sunburn. Cover the scar with sunscreen/sunblock with SPF 30 or higher.   Gently massage the scar using a circular motion to help minimize the appearance of the scar. Do this only after the wound has closed and all the sutures have been removed.   For hypertrophic or keloidal scars, there are several ways to treat and minimize their appearance. Methods include compression therapy, intralesional corticosteroids, laser therapy, or surgery. These methods are performed by your caregiver.  Remember that the scar may appear lighter or darker than your normal skin color. This difference in color should even out with time. SEEK MEDICAL CARE IF:   You have a fever.   You develop signs of infection such as pain, redness, pus, and warmth.   You have questions or concerns.  Document Released: 02/22/2010 Document Revised: 08/24/2011 Document Reviewed: 02/22/2010 [facility] Patient Information 2012 [place], [place].

## 2012-03-15 NOTE — ED Provider Notes (Signed)
History     CSN: [id number]  Arrival date & time 03/15/12  1621   First MD Initiated Contact with Patient 03/15/12 1812      Chief Complaint  Patient presents with  . Wound Check    (Consider location/radiation/quality/duration/timing/severity/associated sxs/prior treatment) HPI Comments: Pt does have some pain in proximal palmar area when hyperextends the wrist, otherwise no pain.   Patient is a 36 y.o. male presenting with wound check. The history is provided by the patient.  Wound Check  He was treated in the ED 10 to 14 days ago. Previous treatment in the ED includes laceration repair. There has been no treatment since the wound repair. There has been no drainage from the wound. There is no redness present. There is no swelling present. The pain has no pain. He has no difficulty moving the affected extremity or digit.    Past Medical History  Diagnosis Date  . Tonsillar hypertrophy   . Sleep apnea     uses CPAP, followed by Dr Shelle Iron  . Overweight   . Heart burn   . Hemorrhoids   . RLS (restless legs syndrome)     Past Surgical History  Procedure Date  . Tonsillectomy 2009  . Finger surgery 2005    ORIF of 5th digit L hand    Family History  Problem Relation Age of Onset  . COPD    . Lung cancer    . Cirrhosis    . Hypertension Mother     History  Substance Use Topics  . Smoking status: Never Smoker   . Smokeless tobacco: Never Used  . Alcohol Use: No      Review of Systems  Constitutional: Negative for fever and chills.  Musculoskeletal:       Hand pain as in hpi  Skin: Positive for wound. Negative for color change.  Neurological: Positive for numbness.       Slightly numb feeling in proximal palmar area (same area as pain with hyperextension of wrist)    Allergies  Review of patient's allergies indicates no known allergies.  Home Medications  No current outpatient prescriptions on file.  BP 132/81  Pulse 62  Temp 98.2 F (36.8 C) (Oral)   Resp 18  SpO2 96%  Physical Exam  Constitutional: He appears well-developed and well-nourished. No distress.  Musculoskeletal:       Left wrist: He exhibits normal range of motion and no tenderness.  Neurological:       Strength in L wrist normal  Skin:       L wrist laceration well healed    ED Course  SUTURE REMOVAL Date/Time: 03/15/2012 6:22 PM Performed by: Cathlyn Parsons Authorized by: Leslee Home C Consent: Verbal consent obtained. Consent given by: patient Patient understanding: patient states understanding of the procedure being performed Patient identity confirmed: verbally with patient Body area: upper extremity Location details: left wrist Wound Appearance: clean Sutures Removed: 4 Comments: Per pt's ER chart on 6/14, I was expecting 6 sutures.  Pt reports other sutures fell out on their own.    (including critical care time)  Labs Reviewed - No data to display No results found.   1. Dressing change/suture removal       MDM  Pt's palmar pain with wrist hyperextension is most likely due to substantial scar tissue from injury, and will likely resolve.  Given contact info for hand surgeon for f/u if needed.         Marzella Schlein  Vassie Moment, NP 03/15/12 1826

## 2012-03-15 NOTE — ED Provider Notes (Signed)
Medical screening examination/treatment/procedure(s) were performed by non-physician practitioner and as supervising physician I was immediately available for consultation/collaboration.  Leslee Home, M.D.   Reuben Likes, MD 03/15/12 [phone]

## 2012-03-28 ENCOUNTER — Ambulatory Visit (INDEPENDENT_AMBULATORY_CARE_PROVIDER_SITE_OTHER): Payer: 59 | Admitting: Pulmonary Disease

## 2012-03-28 ENCOUNTER — Encounter: Payer: Self-pay | Admitting: Pulmonary Disease

## 2012-03-28 ENCOUNTER — Other Ambulatory Visit (INDEPENDENT_AMBULATORY_CARE_PROVIDER_SITE_OTHER): Payer: 59

## 2012-03-28 VITALS — BP 132/84 | HR 63 | Temp 96.7°F | Ht 74.0 in | Wt 224.2 lb

## 2012-03-28 DIAGNOSIS — R5383 Other fatigue: Secondary | ICD-10-CM | POA: Insufficient documentation

## 2012-03-28 DIAGNOSIS — R531 Weakness: Secondary | ICD-10-CM

## 2012-03-28 DIAGNOSIS — R5381 Other malaise: Secondary | ICD-10-CM

## 2012-03-28 DIAGNOSIS — E7800 Pure hypercholesterolemia, unspecified: Secondary | ICD-10-CM

## 2012-03-28 DIAGNOSIS — G2581 Restless legs syndrome: Secondary | ICD-10-CM

## 2012-03-28 DIAGNOSIS — G4733 Obstructive sleep apnea (adult) (pediatric): Secondary | ICD-10-CM

## 2012-03-28 DIAGNOSIS — R55 Syncope and collapse: Secondary | ICD-10-CM

## 2012-03-28 LAB — BASIC METABOLIC PANEL
BUN: 30 mg/dL — ABNORMAL HIGH (ref 6–23)
Calcium: 9.6 mg/dL (ref 8.4–10.5)
Creatinine, Ser: 0.9 mg/dL (ref 0.4–1.5)
GFR: 106.32 mL/min (ref >60.00)
Glucose, Bld: 86 mg/dL (ref 70–99)
Potassium: 4.5 mEq/L (ref 3.5–5.1)

## 2012-03-28 LAB — TSH: TSH: 1.24 u[IU]/mL (ref 0.35–5.50)

## 2012-03-28 LAB — TESTOSTERONE: Testosterone: 373.15 ng/dL (ref 350.00–890.00)

## 2012-03-28 LAB — CBC WITH DIFFERENTIAL/PLATELET
Basophils Relative: 0.3 % (ref 0.0–3.0)
Eosinophils Relative: 1.3 % (ref 0.0–5.0)
HCT: 45.4 % (ref 39.0–52.0)
Lymphs Abs: 1.9 10*3/uL (ref 0.7–4.0)
MCV: 87.2 fl (ref 78.0–100.0)
Monocytes Absolute: 0.5 10*3/uL (ref 0.1–1.0)
Monocytes Relative: 5.5 % (ref 3.0–12.0)
Platelets: 158 10*3/uL (ref 150.0–400.0)
RBC: 5.21 Mil/uL (ref 4.22–5.81)
WBC: 8.8 10*3/uL (ref 4.5–10.5)

## 2012-03-28 LAB — HEPATIC FUNCTION PANEL
Albumin: 4.7 g/dL (ref 3.5–5.2)
Total Bilirubin: 0.7 mg/dL (ref 0.3–1.2)

## 2012-03-28 NOTE — Patient Instructions (Addendum)
Today we updated your med list in our EPIC system...     Today we rechecked your non-fasting blood work & included a Testosterone level to see if we can find a cause for your weak spells...  Call for any questions.Marland KitchenMarland Kitchen
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
## 2012-05-02 ENCOUNTER — Telehealth: Payer: Self-pay | Admitting: Pulmonary Disease

## 2012-05-02 MED ORDER — ALPRAZOLAM 0.5 MG PO TABS: ORAL_TABLET | ORAL | Status: DC

## 2012-05-02 NOTE — Telephone Encounter (Signed)
Called and spoke with pt and he stated that he has been under a lot of stress lately.  They are going through a custody issue about his son and he is worried about this, bills, work, Catering manager.  He is unable to sleep at night and stated that his nerves "are just shot".  Pt is requesting something to take for his nerves.  SN please advise. Thanks  No Known Allergies

## 2012-05-02 NOTE — Telephone Encounter (Signed)
Per SN---ok to call in alprazolam 0.5 mg  #90   Take 1/2 to 1 tablet by mouth three times daily prn nerves.  This has been called to the pharmacy and pt is aware--pt is aware that he will need to be cautious while taking this med and driving. Nothing further needed.

## 2012-10-09 ENCOUNTER — Telehealth: Payer: Self-pay | Admitting: Pulmonary Disease

## 2012-10-09 NOTE — Telephone Encounter (Signed)
Last OV 03/28/12--accidentally closed note

## 2012-10-09 NOTE — Telephone Encounter (Signed)
Can use appt with SN on 1-27 at 11.  Pt will need to bring DOT forms with him to this appt.  thanks

## 2012-10-09 NOTE — Telephone Encounter (Signed)
I spoke with pt and he stated he needs his DOT forms filled out. He needs to see SN ASAP. He is requesting to be worked in with [facility]. Please advise Leigh thanks

## 2012-10-09 NOTE — Telephone Encounter (Signed)
Called spoke with patient, advised of work-in appt.  Pt concerned about waiting that long but advised he is more than welcome to call back to check for cancellations (none at this time).  OV w/ SN made; he will bring DOT forms to ov.

## 2012-10-14 ENCOUNTER — Telehealth: Payer: Self-pay | Admitting: Pulmonary Disease

## 2012-10-14 ENCOUNTER — Encounter: Payer: Self-pay | Admitting: Pulmonary Disease

## 2012-10-14 ENCOUNTER — Ambulatory Visit (INDEPENDENT_AMBULATORY_CARE_PROVIDER_SITE_OTHER): Payer: BC Managed Care – PPO | Admitting: Pulmonary Disease

## 2012-10-14 VITALS — BP 144/72 | HR 58 | Temp 97.0°F | Ht 74.0 in | Wt 231.2 lb

## 2012-10-14 DIAGNOSIS — G4733 Obstructive sleep apnea (adult) (pediatric): Secondary | ICD-10-CM

## 2012-10-14 DIAGNOSIS — G2581 Restless legs syndrome: Secondary | ICD-10-CM

## 2012-10-14 DIAGNOSIS — E7800 Pure hypercholesterolemia, unspecified: Secondary | ICD-10-CM

## 2012-10-14 DIAGNOSIS — E663 Overweight: Secondary | ICD-10-CM

## 2012-10-14 MED ORDER — ALPRAZOLAM 0.5 MG PO TABS: ORAL_TABLET | ORAL | Status: DC

## 2012-10-14 MED ORDER — ROPINIROLE HCL 1 MG PO TABS: 1.0000 mg | ORAL_TABLET | Freq: Every day | ORAL | Status: DC

## 2012-10-14 NOTE — Telephone Encounter (Signed)
Spoke with the pt and notified of recs per Leigh and he verbalized understanding Nothing further needed per pt

## 2012-10-14 NOTE — Telephone Encounter (Signed)
These forms that were completed today are for his regular license.  thanks

## 2012-10-14 NOTE — Patient Instructions (Addendum)
Today we updated your med list in our EPIC system...    Continue your current medications the same...  Today we filled out your [place]-DMV form & we will mail it to [place] as required...  We wrote a new prescription for REQUIP to use as needed for restless leg syndrome...  Continue to use the CPAP nightly & follow up w/ DrClance regarding any issues...  Call for any problems or if we can be of service in any way.Marland KitchenMarland Kitchen
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
## 2012-10-24 ENCOUNTER — Telehealth: Payer: Self-pay | Admitting: Internal Medicine

## 2012-10-24 NOTE — Telephone Encounter (Signed)
Spoke with patient and let him know Dr Kriste Basque has already completed and sent the forms into [place].  To call me if they need to hear from Dr [name] and we can schedule an appointment.  Last OV 01/2011 and says follow up prn.

## 2012-10-24 NOTE — Telephone Encounter (Signed)
New problem   Calling to discuss DOT physical.

## 2012-11-05 ENCOUNTER — Telehealth: Payer: Self-pay | Admitting: Internal Medicine

## 2012-11-05 ENCOUNTER — Telehealth: Payer: Self-pay | Admitting: Pulmonary Disease

## 2012-11-05 NOTE — Telephone Encounter (Signed)
New problem    Status of DOT physical.

## 2012-11-05 NOTE — Telephone Encounter (Signed)
Spoke with the pt He states that the DOT forms that we filled out for him need to be corrected He states that pages 2 and 5 are the ones to be fixed The part where "NA" was written needs to be changed to "something else" and where other impairments was checked yes, needs to be changed to no Leigh, do you still have the forms?? He wants to pick them up asap Please advise, thanks!

## 2012-11-05 NOTE — Telephone Encounter (Signed)
Forms have been completed and faxed back to the [facility].  Nothing further is needed.

## 2012-11-05 NOTE — Telephone Encounter (Signed)
I have printed off Dr Jenel Lucks dictations as we have not seen the patient since 2012.  Dr [name] cleared patient from a cardiac standpoint and had asked that Pulmonary clear him as his syncope was secondary to sleep deprivation vs sleep apnea and not his heart.  I have explained this to the patient and he is going to pick up the forms that we had previously filled out.

## 2013-01-08 ENCOUNTER — Telehealth: Payer: Self-pay | Admitting: Internal Medicine

## 2013-01-08 NOTE — Telephone Encounter (Signed)
New problem     Another note from medical records.  - [employer] .

## 2013-01-14 ENCOUNTER — Encounter: Payer: Self-pay | Admitting: *Deleted

## 2013-01-14 ENCOUNTER — Telehealth: Payer: Self-pay | Admitting: Internal Medicine

## 2013-01-14 NOTE — Telephone Encounter (Signed)
F/u   Pt needs you to return his call

## 2013-01-14 NOTE — Telephone Encounter (Signed)
Follow up    Pt calling because he need another note for his medical records for [employer]. Please call pt

## 2013-01-14 NOTE — Telephone Encounter (Signed)
Spoke with patient and let him know we have not seen him since 2012 and that he should obtain this from Dr Shelle Iron.  He is going to call the [employer] people and let them know.  He will call me back if needed.  I let him know if Dr [name] needed to fill out anything he will have to be seen

## 2013-01-14 NOTE — Telephone Encounter (Signed)
Received request from Nurse.   To:  Fax number: [phone] Attention:To Whom it May Concern  Letter faxed to [phone] 01/14/13/KM

## 2013-01-14 NOTE — Telephone Encounter (Signed)
Letter written and faxed to [phone]

## 2013-01-29 ENCOUNTER — Telehealth: Payer: Self-pay | Admitting: Pulmonary Disease

## 2013-01-29 DIAGNOSIS — R42 Dizziness and giddiness: Secondary | ICD-10-CM

## 2013-01-29 NOTE — Telephone Encounter (Signed)
lmomtcb x1 

## 2013-01-29 NOTE — Telephone Encounter (Signed)
I spoke with pt and he states every once in a while he feels like he is light headed and feels like he has the shakes. Pt states he is not usr eif his BS is low/high what is going on. He has nothing to check it either. Pt is wanting to know if he needs to get this checked. Please advise SN thanks  Last OV 1/714 No pending appt

## 2013-01-29 NOTE — Telephone Encounter (Signed)
Per SN---ok to come in for BMP and A1C

## 2013-01-30 NOTE — Telephone Encounter (Signed)
Orders placed and pt is aware. Kendalynn Wideman, CMA  

## 2013-06-25 ENCOUNTER — Telehealth: Payer: Self-pay | Admitting: Pulmonary Disease

## 2013-06-25 NOTE — Telephone Encounter (Signed)
I spoke with pt. He stated he needs a new mask and hose but is needing an update RX also sent to his DME. He has not seen [facility] since 2012 and so he is scheduled to come in and see [facility] 06/30/13 at 1:30. Nothing further needed

## 2013-06-30 ENCOUNTER — Ambulatory Visit (HOSPITAL_BASED_OUTPATIENT_CLINIC_OR_DEPARTMENT_OTHER): Payer: BC Managed Care – PPO | Attending: Pulmonary Disease | Admitting: Radiology

## 2013-06-30 ENCOUNTER — Ambulatory Visit (INDEPENDENT_AMBULATORY_CARE_PROVIDER_SITE_OTHER): Payer: BC Managed Care – PPO | Admitting: Pulmonary Disease

## 2013-06-30 ENCOUNTER — Encounter (INDEPENDENT_AMBULATORY_CARE_PROVIDER_SITE_OTHER): Payer: Self-pay

## 2013-06-30 ENCOUNTER — Encounter: Payer: Self-pay | Admitting: Pulmonary Disease

## 2013-06-30 VITALS — BP 132/88 | HR 62 | Temp 97.6°F | Ht 74.0 in | Wt 233.8 lb

## 2013-06-30 DIAGNOSIS — G4733 Obstructive sleep apnea (adult) (pediatric): Secondary | ICD-10-CM

## 2013-06-30 NOTE — Assessment & Plan Note (Signed)
The patient continues to have issues with his sleep apnea, and does not feel that he can tolerate a dental appliance after his consultation with dental medicine.  He has tried to get back on CPAP, but continues to struggle with his mask fit and keeping the mask on throughout the night.  He wishes to continue trying the CPAP, but we are going to have to work with him on a different mask type.  Also stressed to him the importance of aggressive weight loss.

## 2013-06-30 NOTE — Patient Instructions (Signed)
Will refer you to the sleep center to look at some of the newer masks, and see if one that will fit you differently. Try putting socks on your hands, or bulky winter gloves during the night to keep you from pulling off the mask as easily. Work on weight loss.  Please let me know how things turn out with a different mask.  Schedule visit with me again in a year, but keep in touch with how things are going with cpap.

## 2013-06-30 NOTE — Progress Notes (Signed)
  Subjective:    Patient ID: David Gamble, male    DOB: 07-05-1976, 37 y.o.   MRN: [id number]  HPI Patient comes in today for followup of his obstructive sleep apnea.  At the last visit, CPAP was discontinued because of complete intolerance, and he was referred to a dentist for consideration of a dental appliance.  After talking with the dentist, the patient did not feel ill with a bilevel therapy for him.  He's gone back to trying CPAP again, but continues to have issues with pulling the mask off throughout the night whenever he tries to wear.  He clearly sees a difference in his sleep and daytime alertness when he is able to wear it more than a few hours.  Unfortunately, his weight continues to increase   Review of Systems  Constitutional: Negative for fever and unexpected weight change.  HENT: Negative for congestion, dental problem, ear pain, nosebleeds, postnasal drip, rhinorrhea, sinus pressure, sneezing, sore throat and trouble swallowing.   Eyes: Negative for redness and itching.  Respiratory: Negative for cough, chest tightness, shortness of breath and wheezing.   Cardiovascular: Negative for palpitations and leg swelling.  Gastrointestinal: Negative for nausea and vomiting.  Genitourinary: Negative for dysuria.  Musculoskeletal: Negative for joint swelling.  Skin: Negative for rash.  Neurological: Negative for headaches.  Hematological: Does not bruise/bleed easily.  Psychiatric/Behavioral: Negative for dysphoric mood. The patient is not nervous/anxious.        Objective:   Physical Exam Overweight male in no Acute distress Nose without purulence or discharge noted No skin breakdown or pressure necrosis from a CPAP mask Neck without lymphadenopathy or thyromegaly Lower extremities without edema, no cyanosis Alert and oriented, does not appear to be sleepy, moves all 4 extremities.       Assessment & Plan:

## 2013-09-27 ENCOUNTER — Emergency Department: Payer: Self-pay | Admitting: Emergency Medicine

## 2013-09-27 LAB — CBC WITH DIFFERENTIAL/PLATELET
BASOS ABS: 0.1 10*3/uL (ref 0.0–0.1)
BASOS PCT: 1.3 %
EOS ABS: 0.1 10*3/uL (ref 0.0–0.7)
Eosinophil %: 1.4 %
HCT: 46.4 % (ref 40.0–52.0)
HGB: 15.6 g/dL (ref 13.0–18.0)
Lymphocyte #: 2.2 10*3/uL (ref 1.0–3.6)
Lymphocyte %: 21.7 %
MCH: 29.3 pg (ref 26.0–34.0)
MCHC: 33.6 g/dL (ref 32.0–36.0)
MCV: 87 fL (ref 80–100)
MONO ABS: 0.7 x10 3/mm (ref 0.2–1.0)
MONOS PCT: 6.8 %
NEUTROS ABS: 6.9 10*3/uL — AB (ref 1.4–6.5)
NEUTROS PCT: 68.8 %
PLATELETS: 224 10*3/uL (ref 150–440)
RBC: 5.34 10*6/uL (ref 4.40–5.90)
RDW: 12.7 % (ref 11.5–14.5)
WBC: 10 10*3/uL (ref 3.8–10.6)

## 2013-09-27 LAB — URINALYSIS, COMPLETE
BLOOD: NEGATIVE
Bacteria: NONE SEEN
Bilirubin,UR: NEGATIVE
GLUCOSE, UR: NEGATIVE mg/dL (ref 0–75)
KETONE: NEGATIVE
Leukocyte Esterase: NEGATIVE
Nitrite: NEGATIVE
Ph: 5 (ref 4.5–8.0)
Protein: NEGATIVE
SPECIFIC GRAVITY: 1.019 (ref 1.003–1.030)
SQUAMOUS EPITHELIAL: NONE SEEN

## 2013-09-27 LAB — COMPREHENSIVE METABOLIC PANEL
ANION GAP: 2 — AB (ref 7–16)
AST: 26 U/L (ref 15–37)
Albumin: 3.9 g/dL (ref 3.4–5.0)
Alkaline Phosphatase: 45 U/L
BILIRUBIN TOTAL: 0.5 mg/dL (ref 0.2–1.0)
BUN: 11 mg/dL (ref 7–18)
CALCIUM: 8.7 mg/dL (ref 8.5–10.1)
CO2: 31 mmol/L (ref 21–32)
CREATININE: 1.04 mg/dL (ref 0.60–1.30)
Chloride: 105 mmol/L (ref 98–107)
EGFR (African American): >60
GLUCOSE: 94 mg/dL (ref 65–99)
OSMOLALITY: 275 (ref 275–301)
POTASSIUM: 4.1 mmol/L (ref 3.5–5.1)
SGPT (ALT): 94 U/L — ABNORMAL HIGH (ref 12–78)
Sodium: 138 mmol/L (ref 136–145)
TOTAL PROTEIN: 7.4 g/dL (ref 6.4–8.2)

## 2014-02-06 ENCOUNTER — Encounter: Payer: Self-pay | Admitting: Internal Medicine

## 2014-07-07 ENCOUNTER — Telehealth: Payer: Self-pay | Admitting: Pulmonary Disease

## 2014-07-07 NOTE — Telephone Encounter (Signed)
Called and spoke with pt and he is aware of appt with SN on Thursday at 9.  Pt is aware to come in fasting.

## 2014-07-09 ENCOUNTER — Ambulatory Visit: Payer: Self-pay | Admitting: Pulmonary Disease

## 2014-08-12 ENCOUNTER — Ambulatory Visit: Payer: Self-pay | Admitting: Internal Medicine

## 2015-05-08 ENCOUNTER — Emergency Department: Payer: No Typology Code available for payment source

## 2015-05-08 ENCOUNTER — Emergency Department
Admission: EM | Admit: 2015-05-08 | Discharge: 2015-05-08 | Disposition: A | Discharge status: Home / Self Care | Payer: No Typology Code available for payment source | Attending: Emergency Medicine | Admitting: Emergency Medicine

## 2015-05-08 ENCOUNTER — Encounter: Payer: Self-pay | Admitting: Emergency Medicine

## 2015-05-08 DIAGNOSIS — M79645 Pain in left finger(s): Secondary | ICD-10-CM

## 2015-05-08 DIAGNOSIS — S299XXA Unspecified injury of thorax, initial encounter: Secondary | ICD-10-CM | POA: Diagnosis present

## 2015-05-08 DIAGNOSIS — Y998 Other external cause status: Secondary | ICD-10-CM | POA: Insufficient documentation

## 2015-05-08 DIAGNOSIS — W1839XA Other fall on same level, initial encounter: Secondary | ICD-10-CM | POA: Insufficient documentation

## 2015-05-08 DIAGNOSIS — S20212A Contusion of left front wall of thorax, initial encounter: Secondary | ICD-10-CM | POA: Diagnosis not present

## 2015-05-08 DIAGNOSIS — E785 Hyperlipidemia, unspecified: Secondary | ICD-10-CM | POA: Insufficient documentation

## 2015-05-08 DIAGNOSIS — Y9389 Activity, other specified: Secondary | ICD-10-CM | POA: Insufficient documentation

## 2015-05-08 DIAGNOSIS — Y9289 Other specified places as the place of occurrence of the external cause: Secondary | ICD-10-CM | POA: Insufficient documentation

## 2015-05-08 DIAGNOSIS — S6992XA Unspecified injury of left wrist, hand and finger(s), initial encounter: Secondary | ICD-10-CM | POA: Insufficient documentation

## 2015-05-08 MED ORDER — TRAMADOL HCL 50 MG PO TABS: 50.0000 mg | ORAL_TABLET | Freq: Four times a day (QID) | ORAL | Status: DC | PRN

## 2015-05-08 MED ORDER — NAPROXEN 500 MG PO TABS: 500.0000 mg | ORAL_TABLET | Freq: Two times a day (BID) | ORAL | Status: DC

## 2015-05-08 NOTE — ED Provider Notes (Signed)
[facility] Emergency Department Provider Note  ____________________________________________  Time seen: Approximately 1:13 PM  I have reviewed the triage vital signs and the nursing notes.   HISTORY  Chief Complaint Rib Injury    HPI David Gamble is a 39 y.o. male patient complaining of left lateral chest wall pain secondary to wrestling accident 3 days ago. He said he fell while resting for sinus and fell on top of him. Patient states nose no bruising or swelling. Patient stated pain increases with deep respirations. Patient denies any shortness of breath. Patient also complaining of pain to the second digit left hand. Patient states this is a chronic problem which she is worse in the last 2-3 months. Patient stated pain increases with flexion of the finger on a press against something or hit something.   Past Medical History  Diagnosis Date  . Tonsillar hypertrophy   . Sleep apnea     uses CPAP, followed by Dr Shelle Iron  . Overweight(278.02)   . Heart burn   . Hemorrhoids   . RLS (restless legs syndrome)     Patient Active Problem List   Diagnosis Date Noted  . Weakness generalized 03/28/2012  . Syncope 01/10/2011  . OBSTRUCTIVE SLEEP APNEA 12/16/2009  . HYPERCHOLESTEROLEMIA 11/23/2009  . HEMORRHOIDS 11/23/2009  . TONSILLAR HYPERTROPHY 11/23/2009  . HEARTBURN 11/23/2009  . RESTLESS LEG SYNDROME 10/12/2009  . OVERWEIGHT 10/24/2007    Past Surgical History  Procedure Laterality Date  . Tonsillectomy  2009  . Finger surgery  2005    ORIF of 5th digit L hand    No current outpatient prescriptions on file.  Allergies Review of patient's allergies indicates no known allergies.  Family History  Problem Relation Age of Onset  . COPD    . Lung cancer    . Cirrhosis    . Hypertension Mother     Social History Social History  Substance Use Topics  . Smoking status: Never Smoker   . Smokeless tobacco: Never Used  . Alcohol Use: No     Review of Systems Constitutional: No fever/chills Eyes: No visual changes. ENT: No sore throat. Cardiovascular: Denies chest pain. Respiratory: Denies shortness of breath. Gastrointestinal: No abdominal pain.  No nausea, no vomiting.  No diarrhea.  No constipation. Genitourinary: Negative for dysuria. Musculoskeletal: Negative for back pain. Skin: Negative for rash. Neurological: Negative for headaches, focal weakness or numbness. Endocrine:Hyperlipidemia 10-point ROS otherwise negative.  ____________________________________________   PHYSICAL EXAM:  VITAL SIGNS: ED Triage Vitals  Enc Vitals Group     BP 05/08/15 1100 135/60 mmHg     Pulse Rate 05/08/15 1100 60     Resp 05/08/15 1100 18     Temp 05/08/15 1100 97.8 F (36.6 C)     Temp Source 05/08/15 1100 Oral     SpO2 05/08/15 1100 97 %     Weight 05/08/15 1100 240 lb (108.863 kg)     Height 05/08/15 1100  (1.88 m)     Head Cir --      Peak Flow --      Pain Score 05/08/15 1101 3     Pain Loc --      Pain Edu? --      Excl. in GC? --     Constitutional: Alert and oriented. Well appearing and in no acute distress. Eyes: Conjunctivae are normal. PERRL. EOMI. Head: Atraumatic. Nose: No congestion/rhinnorhea. Mouth/Throat: Mucous membranes are moist.  Oropharynx non-erythematous. Neck: No stridor.  No cervical  spine tenderness to palpation. Hematological/Lymphatic/Immunilogical: No cervical lymphadenopathy. Cardiovascular: Normal rate, regular rhythm. Grossly normal heart sounds.  Good peripheral circulation. Respiratory: Normal respiratory effort.  No retractions. Lungs CTAB. Gastrointestinal: Soft and nontender. No distention. No abdominal bruits. No CVA tenderness. Musculoskeletal: No chest wall deformity or abrasion or ecchymosis. Patient's tender palpation left #11 directed vertebrae.  Neurologic:  Normal speech and language. No gross focal neurologic deficits are appreciated. No gait  instability. Skin:  Skin is warm, dry and intact. No rash noted. No ecchymosis Psychiatric: Mood and affect are normal. Speech and behavior are normal.  ____________________________________________   LABS (all labs ordered are listed, but only abnormal results are displayed)  Labs Reviewed - No data to display ____________________________________________  EKG   ____________________________________________  RADIOLOGY  No acute findings on x-ray on chest x-ray or second digit left hand. I, Joni Reining, personally viewed and evaluated these images (plain radiographs) as part of my medical decision making.   ____________________________________________   PROCEDURES  Procedure(s) performed: None  Critical Care performed: No  ____________________________________________   INITIAL IMPRESSION / ASSESSMENT AND PLAN / ED COURSE  Pertinent labs & imaging results that were available during my care of the patient were reviewed by me and considered in my medical decision making (see chart for details).  Left chest wall contusion. Chronic left second digit finger pain. Patient advised on supportive care and follow-up with family doctor. ____________________________________________   FINAL CLINICAL IMPRESSION(S) / ED DIAGNOSES  Final diagnoses:  Chest wall contusion, left, initial encounter  Finger pain, left      Joni Reining, PA-C 05/08/15 1407  Minna Antis, MD 05/08/15 [id number]

## 2015-05-08 NOTE — ED Notes (Signed)
L chest wall pain since wrestling 3 days ago, painful with movement

## 2016-03-25 ENCOUNTER — Encounter (HOSPITAL_COMMUNITY): Payer: Self-pay

## 2016-03-25 ENCOUNTER — Emergency Department (HOSPITAL_COMMUNITY)
Admission: EM | Admit: 2016-03-25 | Discharge: 2016-03-25 | Disposition: A | Discharge status: Home / Self Care | Payer: No Typology Code available for payment source | Attending: Emergency Medicine | Admitting: Emergency Medicine

## 2016-03-25 DIAGNOSIS — Y999 Unspecified external cause status: Secondary | ICD-10-CM | POA: Insufficient documentation

## 2016-03-25 DIAGNOSIS — W268XXA Contact with other sharp object(s), not elsewhere classified, initial encounter: Secondary | ICD-10-CM | POA: Insufficient documentation

## 2016-03-25 DIAGNOSIS — Y929 Unspecified place or not applicable: Secondary | ICD-10-CM | POA: Insufficient documentation

## 2016-03-25 DIAGNOSIS — S81812A Laceration without foreign body, left lower leg, initial encounter: Secondary | ICD-10-CM | POA: Insufficient documentation

## 2016-03-25 DIAGNOSIS — Y9389 Activity, other specified: Secondary | ICD-10-CM | POA: Insufficient documentation

## 2016-03-25 MED ORDER — LIDOCAINE-EPINEPHRINE (PF) 2 %-1:200000 IJ SOLN
20.0000 mL | Freq: Once | INTRAMUSCULAR | Status: AC
  Administered 2016-03-25: 20 mL
  Filled 2016-03-25: qty 20

## 2016-03-25 MED ORDER — BACITRACIN ZINC 500 UNIT/GM EX OINT: 1.0000 "application " | TOPICAL_OINTMENT | Freq: Two times a day (BID) | CUTANEOUS | Status: DC

## 2016-03-25 MED ORDER — NAPROXEN 250 MG PO TABS: 250.0000 mg | ORAL_TABLET | Freq: Two times a day (BID) | ORAL | Status: DC

## 2016-03-25 NOTE — ED Provider Notes (Signed)
CSN: [id number]     Arrival date & time 03/25/16  1753 History  By signing my name below, I, Rosario Adie, attest that this documentation has been prepared under the direction and in the presence of Everlene Farrier, PA-C.   Electronically Signed: Rosario Adie, ED Scribe. 03/25/2016. 6:16 PM.   No chief complaint on file.  The history is provided by the patient. No language interpreter was used.   HPI Comments: David Gamble is a 40 y.o. male who presents to the Emergency Department complaining of three sudden onset left lower leg lacerations onset PTA. Bleeding is controlled with pressure. Pt reports that he was working on his bicycle when a part of his bike came across his leg sustaining the lacerations. He thinks this was from the spokes that hold the bike chain.  He states that he has pain to the area of the laceration with ambulation. Pt denies numbness, tingling, or weakness to the leg. Tetanus is UTD (last administered in 2013).  Pt denies any left ankle pain. No other trauma or injury.  Past Medical History  Diagnosis Date  . Tonsillar hypertrophy   . Sleep apnea     uses CPAP, followed by Dr Shelle Iron  . Overweight(278.02)   . Heart burn   . Hemorrhoids   . RLS (restless legs syndrome)    Past Surgical History  Procedure Laterality Date  . Tonsillectomy  2009  . Finger surgery  2005    ORIF of 5th digit L hand   Family History  Problem Relation Age of Onset  . COPD    . Lung cancer    . Cirrhosis    . Hypertension Mother    Social History  Substance Use Topics  . Smoking status: Never Smoker   . Smokeless tobacco: Never Used  . Alcohol Use: No    Review of Systems  Constitutional: Negative for fever.  Skin: Positive for wound (left lower leg).  Neurological: Negative for weakness and numbness.       Negative for tingling.   Allergies  Review of patient's allergies indicates no known allergies.  Home Medications   Prior to Admission medications    Medication Sig Start Date End Date Taking? Authorizing Provider  bacitracin ointment Apply 1 application topically 2 (two) times daily. 03/25/16   Everlene Farrier, PA-C  naproxen (NAPROSYN) 250 MG tablet Take 1 tablet (250 mg total) by mouth 2 (two) times daily with a meal. 03/25/16   Everlene Farrier, PA-C  traMADol (ULTRAM) 50 MG tablet Take 1 tablet (50 mg total) by mouth every 6 (six) hours as needed for moderate pain. 05/08/15   Joni Reining, PA-C   BP 127/65 mmHg  Pulse 65  Temp(Src) 98 F (36.7 C)  Resp 16  Ht  (1.88 m)  Wt 108.863 kg  BMI 30.80 kg/m2  SpO2 95%   Physical Exam  Constitutional: He is oriented to person, place, and time. He appears well-developed and well-nourished. No distress.  HENT:  Head: Normocephalic and atraumatic.  Eyes: Right eye exhibits no discharge. Left eye exhibits no discharge.  Cardiovascular: Normal rate, regular rhythm and intact distal pulses.   DP and TP pulses are intact. Good capillary refill.   Pulmonary/Chest: Effort normal. No respiratory distress.  Musculoskeletal: Normal range of motion. He exhibits no tenderness.  Good strength with plantar and dorsiflexion of his left foot. No bony point tenderness to his left lower leg.  Neurological: He is alert and oriented to  person, place, and time. Coordination normal.  Sensation intact.  Skin: Skin is warm and dry. No rash noted. He is not diaphoretic. No erythema. No pallor.  Three lacerations to the left lower leg. One 1.5cm lac, two lower lacerations that are 2 cm to the left lower lateral leg. Bleeding is controlled. No evidence of foreign body. No deep tissue injury.   Psychiatric: He has a normal mood and affect. His behavior is normal.  Nursing note and vitals reviewed.  ED Course  .Marland KitchenLaceration Repair Date/Time: 03/25/2016 6:50 PM Performed by: Everlene Farrier Authorized by: Everlene Farrier Consent: Verbal consent obtained. Risks and benefits: risks, benefits and alternatives were  discussed Consent given by: patient Patient understanding: patient states understanding of the procedure being performed Patient consent: the patient's understanding of the procedure matches consent given Procedure consent: procedure consent matches procedure scheduled Relevant documents: relevant documents present and verified Test results: test results available and properly labeled Site marked: the operative site was marked Required items: required blood products, implants, devices, and special equipment available Patient identity confirmed: verbally with patient Time out: Immediately prior to procedure a "time out" was called to verify the correct patient, procedure, equipment, support staff and site/side marked as required. Body area: lower extremity Location details: left lower leg Laceration length: 1.5 cm Foreign bodies: no foreign bodies Tendon involvement: none Nerve involvement: none Vascular damage: no Anesthesia: local infiltration Local anesthetic: lidocaine 2% with epinephrine Anesthetic total: 2 ml Patient sedated: no Preparation: Patient was prepped and draped in the usual sterile fashion. Irrigation solution: saline Irrigation method: jet lavage Amount of cleaning: extensive Debridement: none Degree of undermining: none Skin closure: 4-0 Prolene Number of sutures: 3 Technique: simple Approximation: close Approximation difficulty: simple Dressing: antibiotic ointment and non-adhesive packing strip Patient tolerance: Patient tolerated the procedure well with no immediate complications  .Marland KitchenLaceration Repair Date/Time: 03/25/2016 6:58 PM Performed by: Everlene Farrier Authorized by: Everlene Farrier Consent: Verbal consent obtained. Risks and benefits: risks, benefits and alternatives were discussed Consent given by: patient Patient understanding: patient states understanding of the procedure being performed Patient consent: the patient's understanding of the  procedure matches consent given Procedure consent: procedure consent matches procedure scheduled Relevant documents: relevant documents present and verified Test results: test results available and properly labeled Site marked: the operative site was marked Required items: required blood products, implants, devices, and special equipment available Patient identity confirmed: verbally with patient Time out: Immediately prior to procedure a "time out" was called to verify the correct patient, procedure, equipment, support staff and site/side marked as required. Body area: lower extremity Location details: left lower leg Laceration length: 2 cm Foreign bodies: no foreign bodies Tendon involvement: none Nerve involvement: none Vascular damage: no Anesthesia: local infiltration Local anesthetic: lidocaine 2% with epinephrine Anesthetic total: 2 ml Patient sedated: no Preparation: Patient was prepped and draped in the usual sterile fashion. Irrigation solution: saline Irrigation method: jet lavage Amount of cleaning: extensive Debridement: none Degree of undermining: none Skin closure: 4-0 Prolene Number of sutures: 4 Technique: simple Approximation: close Approximation difficulty: complex Dressing: antibiotic ointment and non-adhesive packing strip Patient tolerance: Patient tolerated the procedure well with no immediate complications  .Marland KitchenLaceration Repair Date/Time: 03/25/2016 7:05 PM Performed by: Everlene Farrier Authorized by: Everlene Farrier Consent: Verbal consent obtained. Risks and benefits: risks, benefits and alternatives were discussed Consent given by: patient Patient understanding: patient states understanding of the procedure being performed Patient consent: the patient's understanding of the procedure matches consent given Procedure consent: procedure  consent matches procedure scheduled Relevant documents: relevant documents present and verified Test results: test  results available and properly labeled Site marked: the operative site was marked Required items: required blood products, implants, devices, and special equipment available Patient identity confirmed: verbally with patient Time out: Immediately prior to procedure a "time out" was called to verify the correct patient, procedure, equipment, support staff and site/side marked as required. Body area: lower extremity Location details: left lower leg Laceration length: 2 cm Foreign bodies: no foreign bodies Tendon involvement: none Nerve involvement: none Vascular damage: no Anesthesia: local infiltration Local anesthetic: lidocaine 2% with epinephrine Anesthetic total: 1 ml Patient sedated: no Preparation: Patient was prepped and draped in the usual sterile fashion. Irrigation solution: saline Irrigation method: jet lavage Amount of cleaning: standard Debridement: none Degree of undermining: none Skin closure: 4-0 Prolene Number of sutures: 3 Technique: simple Approximation: close Approximation difficulty: simple Dressing: non-adhesive packing strip and antibiotic ointment Patient tolerance: Patient tolerated the procedure well with no immediate complications   (including critical care time)  DIAGNOSTIC STUDIES: Oxygen Saturation is 98% on RA, normal by my interpretation.   COORDINATION OF CARE: 6:15 PM-Discussed next steps with pt including laceration repair. Pt verbalized understanding and is agreeable with the plan.   Filed Vitals:   03/25/16 1800 03/25/16 1910  BP: 166/105 127/65  Pulse: 66 65  Temp: 98 F (36.7 C)   Resp: 18 16  Height: 6\' 2"  (1.88 m)   Weight: 108.863 kg   SpO2: 98% 95%   MDM   Meds given in ED:  Medications  lidocaine-EPINEPHrine (XYLOCAINE W/EPI) 2 %-1:200000 (PF) injection 20 mL (20 mLs Infiltration Given 03/25/16 1827)    Discharge Medication List as of 03/25/2016  7:09 PM    START taking these medications   Details  bacitracin  ointment Apply 1 application topically 2 (two) times daily., Starting 03/25/2016, Until Discontinued, Print       Final diagnoses:  Leg laceration, left, initial encounter   This is a 40 y.o. male who presents to the Emergency Department complaining of three sudden onset left lower leg lacerations onset PTA. Bleeding is controlled with pressure. Pt reports that he was working on his bicycle when a part of his bike came across his leg sustaining the lacerations. He thinks this was from the spokes that hold the bike chain.  He states that he has pain to the area of the laceration with ambulation. Pt denies numbness, tingling, or weakness to the leg. Tetanus is UTD (last administered in 2013). The 3 lacerations were repaired by me and tolerated well by the patient. A total of 10 sutures were placed. I discussed wound care instructions. I encouraged him to use bacitracin ointment. Advised to follow-up in 7 days for suture removal. Pressure irrigation performed. Laceration occurred < 8 hours prior to repair which was well tolerated. Pt has no co morbidities to effect normal wound healing. Discussed suture home care w pt and answered questions. Pt to f-u for wound check and suture removal in 7 days. Pt is hemodynamically stable w no complaints prior to dc.  I advised the patient to follow-up with their primary care provider this week. I advised the patient to return to the emergency department with new or worsening symptoms or new concerns. The patient verbalized understanding and agreement with plan.     I personally performed the services described in this documentation, which was scribed in my presence. The recorded information has been reviewed and is accurate.  Everlene FarrierWilliam Karielle Davidow, PA-C 03/25/16 1931  Mancel BaleElliott Wentz, MD 03/26/16 [phone]

## 2016-03-25 NOTE — ED Notes (Signed)
Patient here with 2 small lacerations 2 left lateral lower leg after cutting  same on bicycle. Small superficial to same. Bleeding controlled with dressing

## 2016-03-25 NOTE — Discharge Instructions (Signed)

## 2016-07-11 ENCOUNTER — Encounter: Payer: Self-pay | Admitting: Pulmonary Disease

## 2016-07-11 ENCOUNTER — Encounter: Payer: Self-pay | Admitting: *Deleted

## 2016-07-11 ENCOUNTER — Ambulatory Visit (INDEPENDENT_AMBULATORY_CARE_PROVIDER_SITE_OTHER): Payer: BLUE CROSS/BLUE SHIELD | Admitting: Pulmonary Disease

## 2016-07-11 VITALS — BP 134/82 | HR 68 | Temp 97.2°F | Ht 74.0 in | Wt 248.2 lb

## 2016-07-11 DIAGNOSIS — G4733 Obstructive sleep apnea (adult) (pediatric): Secondary | ICD-10-CM

## 2016-07-11 DIAGNOSIS — Z9889 Other specified postprocedural states: Secondary | ICD-10-CM | POA: Diagnosis not present

## 2016-07-11 DIAGNOSIS — E663 Overweight: Secondary | ICD-10-CM

## 2016-07-11 DIAGNOSIS — G2581 Restless legs syndrome: Secondary | ICD-10-CM

## 2016-07-11 DIAGNOSIS — F341 Dysthymic disorder: Secondary | ICD-10-CM

## 2016-07-11 DIAGNOSIS — R531 Weakness: Secondary | ICD-10-CM | POA: Diagnosis not present

## 2016-07-11 MED ORDER — ROPINIROLE HCL 1 MG PO TABS: 1.0000 mg | ORAL_TABLET | Freq: Every day | ORAL | 5 refills | Status: DC

## 2016-07-11 MED ORDER — ESCITALOPRAM OXALATE 10 MG PO TABS: 10.0000 mg | ORAL_TABLET | Freq: Every day | ORAL | 5 refills | Status: DC

## 2016-07-11 NOTE — Patient Instructions (Signed)
Herrick-- it was good seeing you again...  Let[facility] try for a [facility], to see what pressure settings are best for you AND to give you an opportunity to try the newer masks and different apparatus avail...    When we see these results we will ask [employer] to provide a new machine 7 let them know how to set it up for you...  For your RESTLESS LEG SYNDROME>>    Try the REQUIP (generic) 1mg  tabs taking 1 tab about an hour before bedtime...  For the DEPRESSION>>    Try the new LEXAPRO (generic) 10mg  tabs- one tab about an hour before bedtime...  Please give us a call in about 2 wks>>    Let us know how you are resting at night & I especially want to know how your wife feeling the REQUIP is working on your restless legs; we can make dose adjustments based on this information...  Call for any questions...  Let's plan a follow up visit in about 6-8 weeks, sooner if needed for problems.Marland Kitchen..Marland Kitchen
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
## 2016-07-20 ENCOUNTER — Ambulatory Visit: Payer: Self-pay | Admitting: Podiatry

## 2016-08-22 ENCOUNTER — Ambulatory Visit: Payer: Self-pay | Admitting: Pulmonary Disease

## 2016-08-30 ENCOUNTER — Telehealth: Payer: Self-pay | Admitting: Pulmonary Disease

## 2016-08-30 DIAGNOSIS — G4733 Obstructive sleep apnea (adult) (pediatric): Secondary | ICD-10-CM

## 2016-08-30 NOTE — Telephone Encounter (Signed)
David Gamble called from the sleep lab and stated that the pt has not been using his cpap in several years regularly.  He is not sure if the pts insurance will cover the cpap titration.  SN please advise. thanks

## 2016-08-31 NOTE — Telephone Encounter (Signed)
Per SN---  SN is not understanding this.  Pt has significant OSA--he didn't fail CPAP---can we do a split night sleep study?

## 2016-08-31 NOTE — Telephone Encounter (Signed)
SN  Spoke with someone at the sleep lab and they stated he can have a split night. They stated the reason a cpap titration would not work is because of insurance purposes. They stated that when it came time for pt. To get supplies for his machine, the dme is going to look to see if he had a study within the last 6 months.

## 2016-09-01 ENCOUNTER — Encounter (HOSPITAL_BASED_OUTPATIENT_CLINIC_OR_DEPARTMENT_OTHER): Payer: Self-pay

## 2016-09-01 ENCOUNTER — Ambulatory Visit (HOSPITAL_BASED_OUTPATIENT_CLINIC_OR_DEPARTMENT_OTHER): Payer: BLUE CROSS/BLUE SHIELD | Attending: Pulmonary Disease | Admitting: Pulmonary Disease

## 2016-09-01 ENCOUNTER — Other Ambulatory Visit: Payer: Self-pay

## 2016-09-01 DIAGNOSIS — G4733 Obstructive sleep apnea (adult) (pediatric): Secondary | ICD-10-CM | POA: Diagnosis present

## 2016-09-01 MED ORDER — ESCITALOPRAM OXALATE 10 MG PO TABS: 10.0000 mg | ORAL_TABLET | Freq: Every day | ORAL | 0 refills | Status: DC

## 2016-09-01 MED ORDER — ROPINIROLE HCL 1 MG PO TABS
1.0000 mg | ORAL_TABLET | Freq: Every day | ORAL | 0 refills | Status: DC
Start: 2016-09-01 — End: 2016-09-08

## 2016-09-01 NOTE — Telephone Encounter (Signed)
Split night sleep study order has been placed. Nothing further is needed.

## 2016-09-06 ENCOUNTER — Telehealth: Payer: Self-pay | Admitting: Pulmonary Disease

## 2016-09-06 DIAGNOSIS — G4733 Obstructive sleep apnea (adult) (pediatric): Secondary | ICD-10-CM | POA: Diagnosis not present

## 2016-09-06 NOTE — Telephone Encounter (Signed)
Pt wants to called after 4pm. Will leave in triage to call at that time.

## 2016-09-06 NOTE — Procedures (Signed)
Patient Name: David Gamble, Bayard Study Date: 09/01/2016 Gender: Male D.O.B: 01/01/1976 Age (years): [age] Referring Provider: Alroy DustScott Nadel Height (inches): 74 Interpreting Physician: Cyril Mourningakesh Alva MD, ABSM Weight (lbs): 245 RPSGT: Rolene ArbourMcConnico, Yvonne BMI: 31 MRN: [id number] Neck Size: 17.50 CLINICAL INFORMATION Sleep Study Type: NPSG  Indication for sleep study: OSA, PSG 2009 w/ AHI=24 w/ desat to 79%; tonsillectomy & uvulectomy 02/2008 by DrShoemaker   Epworth Sleepiness Score: 15     SLEEP STUDY TECHNIQUE As per the AASM Manual for the Scoring of Sleep and Associated Events v2.3 (April 2016) with a hypopnea requiring 4% desaturations.  The channels recorded and monitored were frontal, central and occipital EEG, electrooculogram (EOG), submentalis EMG (chin), nasal and oral airflow, thoracic and abdominal wall motion, anterior tibialis EMG, snore microphone, electrocardiogram, and pulse oximetry.  MEDICATIONS Medications self-administered by patient taken the night of the study : REQUIP, MELATONIN  SLEEP ARCHITECTURE The study was initiated at 9:27:21 PM and ended at 4:26:38 AM.  Sleep onset time was 19.9 minutes and the sleep efficiency was 83.8%. The total sleep time was 351.4 minutes.  Stage REM latency was 75.0 minutes.  The patient spent 1.85% of the night in stage N1 sleep, 64.14% in stage N2 sleep, 0.00% in stage N3 and 34.01% in REM.  Alpha intrusion was absent.  Supine sleep was 11.51%.  RESPIRATORY PARAMETERS The overall apnea/hypopnea index (AHI) was 4.8 per hour. There were 1 total apneas, including 0 obstructive, 1 central and 0 mixed apneas. There were 27 hypopneas and 3 RERAs.  The AHI during Stage REM sleep was 1.0 per hour.  AHI while supine was 32.7 per hour.  The mean oxygen saturation was 91.17%.   Loud snoring was noted during this study.  CARDIAC DATA The 2 lead EKG demonstrated sinus rhythm. The mean heart rate was 67.58 beats per minute. Other EKG  findings include: None.   LEG MOVEMENT DATA The total PLMS were 0 with a resulting PLMS index of 0.00. Associated arousal with leg movement index was 0.0 .  IMPRESSIONS - No significant obstructive sleep apnea occurred during this study (AHI = 4.8/h). - No significant central sleep apnea occurred during this study (CAI = 0.2/h). - Severe oxygen desaturation was noted during this study (Min O2 = 0.00%). - The patient snored with Loud snoring volume. - No cardiac abnormalities were noted during this study. - Clinically significant periodic limb movements did not occur during sleep. No significant associated arousals.    DIAGNOSIS - OSA, positional in the supine position, not significant in lateral position   RECOMMENDATIONS - Positional therapy avoiding supine position during sleep. - Avoid alcohol, sedatives and other CNS depressants that may worsen sleep apnea and disrupt normal sleep architecture. - Sleep hygiene should be reviewed to assess factors that may improve sleep quality. - Weight management and regular exercise should be initiated or continued if appropriate.   Cyril Mourningakesh Alva MD Board Certified in Sleep medicine

## 2016-09-07 NOTE — Telephone Encounter (Signed)
LMTCB x 1 

## 2016-09-08 MED ORDER — ROPINIROLE HCL 1 MG PO TABS: 1.0000 mg | ORAL_TABLET | Freq: Every day | ORAL | 1 refills | Status: DC

## 2016-09-08 MED ORDER — ESCITALOPRAM OXALATE 10 MG PO TABS: 10.0000 mg | ORAL_TABLET | Freq: Every day | ORAL | 1 refills | Status: DC

## 2016-09-08 NOTE — Telephone Encounter (Signed)
Pt returning call and says [facility] [place] [address]Caren GriffinsStanley A Dalton

## 2016-09-08 NOTE — Telephone Encounter (Signed)
Spoke with pt. He is aware that we will send in these prescriptions for him. Nothing further was needed at this time.

## 2016-09-08 NOTE — Telephone Encounter (Signed)
Per SN---he has reviewed the results.    We are going to refill the meds for him--just need to find out which pharmacy to send these in to--TD will speak with pt and let us know.   lexapro 10 mg  Once daily requip 1 mg  1 daily at bedtime.    Pt does not need cpap.  He needs to stay off of his back at night when he is sleeping.  He can try the tennis ball trick, use bolster pillows to keep him off of his back or he can use any trick that will keep him off of his back while he is sleeping. His sleep study showed no signs of OSA, except when he is on his back.

## 2016-09-08 NOTE — Telephone Encounter (Signed)
I have attempted to call the pt to see what pharmacy he would like his meds sent to.  [phone] Will wait for call back from pt.

## 2016-11-03 ENCOUNTER — Encounter: Payer: Self-pay | Admitting: Podiatry

## 2016-11-06 ENCOUNTER — Ambulatory Visit: Payer: Self-pay | Admitting: Podiatry

## 2016-11-10 NOTE — Progress Notes (Signed)
This encounter was created in error - please disregard.

## 2016-11-28 ENCOUNTER — Encounter: Payer: Self-pay | Admitting: Podiatry

## 2016-11-28 ENCOUNTER — Ambulatory Visit (INDEPENDENT_AMBULATORY_CARE_PROVIDER_SITE_OTHER): Payer: BLUE CROSS/BLUE SHIELD

## 2016-11-28 ENCOUNTER — Ambulatory Visit (INDEPENDENT_AMBULATORY_CARE_PROVIDER_SITE_OTHER): Payer: BLUE CROSS/BLUE SHIELD | Admitting: Podiatry

## 2016-11-28 DIAGNOSIS — M79671 Pain in right foot: Secondary | ICD-10-CM

## 2016-11-28 DIAGNOSIS — Q667 Congenital pes cavus, unspecified foot: Secondary | ICD-10-CM

## 2016-11-28 DIAGNOSIS — M722 Plantar fascial fibromatosis: Secondary | ICD-10-CM | POA: Diagnosis not present

## 2016-11-28 DIAGNOSIS — M7750 Other enthesopathy of unspecified foot: Secondary | ICD-10-CM | POA: Diagnosis not present

## 2016-11-28 DIAGNOSIS — M79672 Pain in left foot: Secondary | ICD-10-CM

## 2016-11-28 DIAGNOSIS — Q828 Other specified congenital malformations of skin: Secondary | ICD-10-CM

## 2016-11-28 DIAGNOSIS — M779 Enthesopathy, unspecified: Secondary | ICD-10-CM

## 2016-11-28 DIAGNOSIS — M778 Other enthesopathies, not elsewhere classified: Secondary | ICD-10-CM

## 2016-11-28 NOTE — Progress Notes (Signed)
   Subjective:    Patient ID: David HugerJohn V Gamble, male    DOB: 1976-01-04, 41 y.o.   MRN: [id number]  HPI: He presents today with chief complaint of pain to the dorsal lateral aspect of the bilateral foot. He states that he wear steel toe shoes to work he states that this is been going on for quite some time and has seen another podiatrist in the past who performed injections. He states that the left foot seems to be about more bothersome than the right. He also has a painful lesion for the past 2 months beneath the first metatarsophalangeal joint.    Review of Systems  Musculoskeletal: Positive for back pain, gait problem and myalgias.  All other systems reviewed and are negative.      Objective:   Physical Exam: Vital signs are stable he is alert and oriented 3. Pulses are palpable. Neurologic sensorium is intact. Deep tendon reflexes are intact. Muscle strength was 5 over 5 dorsiflexion plantar flexors and inverters everters all edges of musculature is intact. Mild cavus foot deformity with pain on range of motion of the fourth and fifth metatarsal cuboid articulation bilaterally left greater than right. Radiographs taken today do not demonstrate any major osseous abnormalities in this area. He does have some hypopigmentation overlying these joints secondary to multiple injections. Solitary porokeratotic lesion beneath the first metatarsophalangeal joint of the right foot consistent with porokeratosis. Does not appear to be verrucoid in nature.        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 Porokeratosis right foot.  Plan: He was casted today for set of orthotics and we also performed chemical destruction of lesion beneath the first metatarsophalangeal joint. He was instructed to leave this medication on until tomorrow morning and wash it off thoroughly and I will follow-up with him once his orthotics come in.

## 2016-12-26 ENCOUNTER — Ambulatory Visit: Payer: BLUE CROSS/BLUE SHIELD

## 2017-01-08 ENCOUNTER — Other Ambulatory Visit: Payer: BLUE CROSS/BLUE SHIELD

## 2017-03-31 ENCOUNTER — Emergency Department (HOSPITAL_COMMUNITY): Payer: BLUE CROSS/BLUE SHIELD

## 2017-03-31 ENCOUNTER — Encounter (HOSPITAL_COMMUNITY): Payer: Self-pay | Admitting: Emergency Medicine

## 2017-03-31 ENCOUNTER — Emergency Department (HOSPITAL_COMMUNITY)
Admission: EM | Admit: 2017-03-31 | Discharge: 2017-03-31 | Disposition: A | Discharge status: Home / Self Care | Payer: BLUE CROSS/BLUE SHIELD | Attending: Emergency Medicine | Admitting: Emergency Medicine

## 2017-03-31 DIAGNOSIS — S99922A Unspecified injury of left foot, initial encounter: Secondary | ICD-10-CM | POA: Diagnosis present

## 2017-03-31 DIAGNOSIS — Y9311 Activity, swimming: Secondary | ICD-10-CM | POA: Diagnosis not present

## 2017-03-31 DIAGNOSIS — Y9234 Swimming pool (public) as the place of occurrence of the external cause: Secondary | ICD-10-CM | POA: Diagnosis not present

## 2017-03-31 DIAGNOSIS — S92255A Nondisplaced fracture of navicular [scaphoid] of left foot, initial encounter for closed fracture: Secondary | ICD-10-CM | POA: Insufficient documentation

## 2017-03-31 DIAGNOSIS — Z79899 Other long term (current) drug therapy: Secondary | ICD-10-CM | POA: Insufficient documentation

## 2017-03-31 DIAGNOSIS — Y998 Other external cause status: Secondary | ICD-10-CM | POA: Insufficient documentation

## 2017-03-31 DIAGNOSIS — S92252A Displaced fracture of navicular [scaphoid] of left foot, initial encounter for closed fracture: Secondary | ICD-10-CM

## 2017-03-31 DIAGNOSIS — W1789XA Other fall from one level to another, initial encounter: Secondary | ICD-10-CM | POA: Diagnosis not present

## 2017-03-31 MED ORDER — IBUPROFEN 800 MG PO TABS: 800.0000 mg | ORAL_TABLET | Freq: Three times a day (TID) | ORAL | 0 refills | Status: DC | PRN

## 2017-03-31 MED ORDER — HYDROCODONE-ACETAMINOPHEN 5-325 MG PO TABS: 1.0000 | ORAL_TABLET | Freq: Four times a day (QID) | ORAL | 0 refills | Status: DC | PRN

## 2017-03-31 MED ORDER — OXYCODONE-ACETAMINOPHEN 5-325 MG PO TABS
ORAL_TABLET | ORAL | Status: AC
  Filled 2017-03-31: qty 1

## 2017-03-31 MED ORDER — OXYCODONE-ACETAMINOPHEN 5-325 MG PO TABS
1.0000 | ORAL_TABLET | Freq: Once | ORAL | Status: AC
  Administered 2017-03-31: 1 via ORAL

## 2017-03-31 NOTE — ED Notes (Signed)
Patient returned from X-ray 

## 2017-03-31 NOTE — Progress Notes (Signed)
Orthopedic Tech Progress Note Patient Details:  David HugerJohn V Gamble [birth year] [id number]  Ortho Devices Type of Ortho Device: Short leg splint, Crutches Ortho Device/Splint Interventions: Application   Saul FordyceJennifer C [name] 03/31/2017, 4:28 PM

## 2017-03-31 NOTE — Discharge Instructions (Signed)
Take ibuprofen as needed for mild or moderate pain. Take this with meals. Do not take other anti-inflammatories at the same time (Advil, Motrin, Aleve, naproxen). Take Norco as needed for severe pain. Keep the leg splint on, and use crutches to remain nonweightbearing until seen by orthopedics Follow-up with orthopedics. Return to the emergency department if you develop numbness, tingling, or any new or worsening symptoms.

## 2017-03-31 NOTE — ED Triage Notes (Signed)
Pt. Stated, I was going off a diving board and my foot curled under.

## 2017-03-31 NOTE — ED Notes (Signed)
Patient transported to X-ray 

## 2017-03-31 NOTE — ED Provider Notes (Signed)
[facility]EMERGENCY DEPT Provider Note   CSN: [id number] Arrival date & time: 03/31/17  1345     History   Chief Complaint Chief Complaint  Patient presents with  . Foot Pain  . Foot Injury    HPI David Gamble is a 41 y.o. male presenting with left foot pain.  Patient states that he was stepping off a diving board when he slipped, and plantar flexed his foot when he caught it on the diving board prior to falling in the water. He reports pain at the dorsal/lateral aspect of his left foot. He has not walked on it since the injury since he as scared to do so, and is unsure if he can bear weight. He denies numbness or tingling of his foot. He is able to move his toes with minimal pain. He is able to move his ankle with minimal pain. He denies injury elsewhere. He has a history of injury to this ankle in the past. He does not take blood thinners.  HPI  Past Medical History:  Diagnosis Date  . Heart burn   . Hemorrhoids   . Overweight(278.02)   . RLS (restless legs syndrome)   . Sleep apnea    uses CPAP, followed by Dr Shelle Iron  . Tonsillar hypertrophy     Patient Active Problem List   Diagnosis Date Noted  . History of ear, nose, and throat (ENT) surgery 07/11/2016  . Dysthymia 07/11/2016  . Weakness generalized 03/28/2012  . Syncope 01/10/2011  . Obstructive sleep apnea 12/16/2009  . HYPERCHOLESTEROLEMIA 11/23/2009  . HEMORRHOIDS 11/23/2009  . HEARTBURN 11/23/2009  . RESTLESS LEG SYNDROME 10/12/2009  . Overweight 10/24/2007    Past Surgical History:  Procedure Laterality Date  . FINGER SURGERY  2005   ORIF of 5th digit L hand  . TONSILLECTOMY  2009       Home Medications    Prior to Admission medications   Medication Sig Start Date End Date Taking? Authorizing Provider  escitalopram (LEXAPRO) 10 MG tablet Take 1 tablet (10 mg total) by mouth daily. 09/08/16   Michele Mcalpine, MD  HYDROcodone-acetaminophen (NORCO/VICODIN) 5-325 MG tablet Take 1-2 tablets by mouth  every 6 (six) hours as needed for severe pain. 03/31/17   Arissa Fagin, PA-C  ibuprofen (ADVIL,MOTRIN) 800 MG tablet Take 1 tablet (800 mg total) by mouth 3 (three) times daily with meals as needed for mild pain or moderate pain. 03/31/17   Samamtha Tiegs, PA-C  naproxen (NAPROSYN) 250 MG tablet Take 1 tablet (250 mg total) by mouth 2 (two) times daily with a meal. 03/25/16   Everlene Farrier, PA-C  rOPINIRole (REQUIP) 1 MG tablet Take 1 tablet (1 mg total) by mouth at bedtime. 09/08/16   Michele Mcalpine, MD    Family History Family History  Problem Relation Age of Onset  . COPD Unknown   . Lung cancer Unknown   . Cirrhosis Unknown   . Hypertension Mother     Social History Social History  Substance Use Topics  . Smoking status: Never Smoker  . Smokeless tobacco: Never Used  . Alcohol use No     Allergies   Patient has no known allergies.   Review of Systems Review of Systems  Musculoskeletal: Positive for arthralgias. Negative for neck pain and neck stiffness.  Skin: Positive for wound.  Neurological: Negative for dizziness, numbness and headaches.     Physical Exam Updated Vital Signs BP 121/86 (BP Location: Left Arm)   Pulse 82  Temp 98.9 F (37.2 C) (Oral)   Resp 17   Ht 6\' 2"  (1.88 m)   Wt 111.1 kg (245 lb)   SpO2 96%   BMI 31.46 kg/m   Physical Exam  Constitutional: He is oriented to person, place, and time. He appears well-developed and well-nourished. No distress.  HENT:  Head: Normocephalic and atraumatic.  Eyes: Pupils are equal, round, and reactive to light.  Neck: Normal range of motion.  Cardiovascular: Normal rate and regular rhythm.   Pulmonary/Chest: Effort normal and breath sounds normal.  Musculoskeletal:  Superficial abrasion of the dorsal aspect of the left foot, and great toe. No significant hematoma underlying the abrasion. Patient with minimal swelling of the dorsal lateral aspect of the foot. Full range of motion of the toes with  minimal pain. Full range of motion of ankle with no pain. Pulses intact bilaterally. Sensation intact bilaterally. Color and warmth equal bilaterally. Tenderness to palpation of the dorsal lateral foot just distal to the lateral malleolus. No tenderness to palpation of the plantar aspect of the foot. No injuries noted elsewhere. Compartments soft.  Neurological: He is alert and oriented to person, place, and time.  Skin: Skin is warm and dry.  Psychiatric: He has a normal mood and affect.  Nursing note and vitals reviewed.    ED Treatments / Results  Labs (all labs ordered are listed, but only abnormal results are displayed) Labs Reviewed - No data to display  EKG  EKG Interpretation None       Radiology Dg Foot Complete Left  Result Date: 03/31/2017 CLINICAL DATA:  LEFT foot pain after an accident while diving from a diving board into a pool, twisted LEFT foot underneath, laceration at dorsum of foot EXAM: LEFT FOOT - COMPLETE 3+ VIEW COMPARISON:  None FINDINGS: Osseous mineralization normal. Joint spaces preserved. Small plantar calcaneal spur. Small cortical avulsion fracture from the dorsal margin of the tarsal navicular on lateral view with overlying soft tissue swelling. No additional fracture, dislocation, or bone destruction. IMPRESSION: Cortical avulsion fracture from the dorsal margin of the tarsal navicular adjacent to the talonavicular joint question capsular avulsion injury with overlying soft tissue swelling. Electronically Signed   By: Ulyses SouthwardMark  Boles M.D.   On: 03/31/2017 14:52    Procedures Procedures (including critical care time)  Medications Ordered in ED Medications  oxyCODONE-acetaminophen (PERCOCET/ROXICET) 5-325 MG per tablet (not administered)  oxyCODONE-acetaminophen (PERCOCET/ROXICET) 5-325 MG per tablet 1 tablet (1 tablet Oral Given 03/31/17 1357)     Initial Impression / Assessment and Plan / ED Course  I have reviewed the triage vital signs and the  nursing notes.  Pertinent labs & imaging results that were available during my care of the patient were reviewed by me and considered in my medical decision making (see chart for details).     Patient presents with pain to the left foot after plantar flexion. Physical exam shows tenderness to palpation of the dorsal lateral aspect of the foot, with no tenderness to palpation the plantar foot. Minimal swelling, and patient with full range motion of the ankle and toes. X-ray shows avulsion of the tarsal navicular bone. Case discussed with attending, Dr. Madilyn Hookees evaluated the patient. Patient neurovascularly intact with soft compartments. Patient placed in posterior splint and crutches for nonweightbearing. Patient follow-up with orthopedics for further evaluation. Short prescription for pain medicine given. Return precautions given. Patient states he understands and agrees to plan.  Final Clinical Impressions(s) / ED Diagnoses   Final diagnoses:  Closed  avulsion fracture of navicular bone of left foot, initial encounter    New Prescriptions Discharge Medication List as of 03/31/2017  3:28 PM    START taking these medications   Details  HYDROcodone-acetaminophen (NORCO/VICODIN) 5-325 MG tablet Take 1-2 tablets by mouth every 6 (six) hours as needed for severe pain., Starting Sat 03/31/2017, Print    ibuprofen (ADVIL,MOTRIN) 800 MG tablet Take 1 tablet (800 mg total) by mouth 3 (three) times daily with meals as needed for mild pain or moderate pain., Starting Sat 03/31/2017, Print         Birchwood, Felton, PA-C 03/31/17 1717    Tilden Fossa, MD 04/11/17 1243

## 2017-06-06 ENCOUNTER — Ambulatory Visit: Payer: BLUE CROSS/BLUE SHIELD | Admitting: Podiatry

## 2017-08-02 ENCOUNTER — Encounter: Payer: Self-pay | Admitting: Podiatry

## 2017-08-02 ENCOUNTER — Ambulatory Visit: Payer: BLUE CROSS/BLUE SHIELD | Admitting: Podiatry

## 2017-08-02 DIAGNOSIS — B079 Viral wart, unspecified: Secondary | ICD-10-CM

## 2017-08-02 NOTE — Patient Instructions (Signed)
Warts Warts are small growths on the skin. They are common, and they are caused by a type of germ (virus). Warts can occur on many areas of the body. A person may have one wart or more than one wart. Warts can spread if you scratch a wart and then scratch normal skin. Most warts will go away over many months to a couple years. Treatments may be done if needed. Follow these instructions at home:  Apply over-the-counter and prescription medicines only as told by your doctor.  Do not apply over-the-counter wart medicines to your face or genitals before you ask your doctor if it is okay to do that.  Do not scratch or pick at a wart.  Wash your hands after you touch a wart.  Avoid shaving hair that is over a wart.  Keep all follow-up visits as told by your doctor. This is important. Contact a doctor if:  Your warts do not improve after treatment.  You have redness, swelling, or pain at the site of a wart.  You have bleeding from a wart, and the bleeding does not stop when you put light pressure on the wart.  You have diabetes and you get a wart. This information is not intended to replace advice given to you by your health care provider. Make sure you discuss any questions you have with your health care provider. Document Released: 01/05/2011 Document Revised: 02/10/2016 Document Reviewed: 11/30/2014 Elsevier Interactive Patient Education  2018 [employer] Inc[employer]  

## 2017-08-02 NOTE — Progress Notes (Signed)
c 

## 2017-08-03 NOTE — Progress Notes (Signed)
Subjective:    Patient ID: David Gamble, male   DOB: 41 y.o.   MRN: [id number]   HPI patient presents with a very bad lesion on the bottom of the right first metatarsal measuring about 8 mm x 8 mm that upon debridement shows pinpoint bleeding and is painful to lateral pressure. 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 Patient wants excision I explained procedure and risk and today I went ahead and injected with 60 Milligan Xylocaine with epinephrine and after sterile prep of the area I excised the mass entirely and exposed the base applied phenol and sterile dressing. I then gave instructions on reduced activity and applied padding around it and hopefully he will be able to return to work. I placed in formalin and sent for pathological evaluation

## 2017-09-03 ENCOUNTER — Ambulatory Visit: Payer: BLUE CROSS/BLUE SHIELD | Admitting: Podiatry

## 2017-09-07 ENCOUNTER — Ambulatory Visit: Payer: BLUE CROSS/BLUE SHIELD | Admitting: Podiatry

## 2017-09-17 ENCOUNTER — Encounter: Payer: BLUE CROSS/BLUE SHIELD | Admitting: Podiatry

## 2017-09-25 NOTE — Progress Notes (Signed)
This encounter was created in error - please disregard.

## 2018-02-20 ENCOUNTER — Encounter

## 2018-02-20 ENCOUNTER — Ambulatory Visit: Payer: BLUE CROSS/BLUE SHIELD | Admitting: Neurology

## 2018-02-20 ENCOUNTER — Encounter: Payer: Self-pay | Admitting: Neurology

## 2018-02-20 VITALS — BP 122/79 | HR 57 | Ht 74.0 in | Wt 247.5 lb

## 2018-02-20 DIAGNOSIS — G8929 Other chronic pain: Secondary | ICD-10-CM

## 2018-02-20 DIAGNOSIS — M5442 Lumbago with sciatica, left side: Secondary | ICD-10-CM | POA: Diagnosis not present

## 2018-02-20 MED ORDER — MELOXICAM 15 MG PO TABS: 15.0000 mg | ORAL_TABLET | Freq: Every day | ORAL | 4 refills | Status: DC

## 2018-02-20 NOTE — Patient Instructions (Signed)
   Start Mobic for the back pain. We will get an XR of the low back.

## 2018-02-20 NOTE — Progress Notes (Signed)
Reason for visit: Chronic low back pain  Referring physician: Dr. Ronney Lion is a 42 y.o. male  History of present illness:  David Gamble is a 42 year old left-handed white male with a history of obesity and chronic low back pain.  The patient has seen a chiropractor in the past without much benefit.  The patient indicates that he has had some difficulty with his back for at least 10 years with pain that will come and go.  Six or 8 weeks ago he had a significant flareup of his back pain.  The pain is in the left flank area and goes down into the lateral thigh area 1/2 way between the hip and knee.  The patient denies any numbness or tingling sensations, he denies any weakness in the legs.  He feels better when he is able to extend the back, he feels worse when he flexes.  The patient has a job that requires sitting and standing frequently, he believes that this does worsen the pain.  If he has to sit too long such as a long car ride, this will significantly worsen the pain.  He feels better when he is lying down.  He is involved with yoga which does seem to help his back.  The patient denies any balance issues, he denies any issues controlling the bowels of the bladder.  He does have some neck stiffness without any discomfort going down the arms.  He will take ibuprofen on occasion, it does help when he uses the medication.  The patient is sent to this office for an evaluation.   Past Medical History:  Diagnosis Date  . Heart burn   . Hemorrhoids   . Overweight(278.02)   . RLS (restless legs syndrome)   . Sleep apnea    uses CPAP, followed by Dr Shelle Iron  . Tonsillar hypertrophy     Past Surgical History:  Procedure Laterality Date  . FINGER SURGERY  2005   ORIF of 5th digit L hand  . TONSILLECTOMY  2009    Family History  Problem Relation Age of Onset  . COPD Unknown   . Lung cancer Unknown   . Cirrhosis Unknown   . Hypertension Mother     Social history:  reports  that he has never smoked. He has never used smokeless tobacco. He reports that he does not drink alcohol or use drugs.  Medications:  Prior to Admission medications   Medication Sig Start Date End Date Taking? Authorizing Provider  escitalopram (LEXAPRO) 10 MG tablet Take 1 tablet (10 mg total) by mouth daily. 09/08/16  Yes Michele Mcalpine, MD  HYDROcodone-acetaminophen (NORCO/VICODIN) 5-325 MG tablet Take 1-2 tablets by mouth every 6 (six) hours as needed for severe pain. 03/31/17  Yes Caccavale, Sophia, PA-C  ibuprofen (ADVIL,MOTRIN) 800 MG tablet Take 1 tablet (800 mg total) by mouth 3 (three) times daily with meals as needed for mild pain or moderate pain. 03/31/17  Yes Caccavale, Sophia, PA-C  naproxen (NAPROSYN) 250 MG tablet Take 1 tablet (250 mg total) by mouth 2 (two) times daily with a meal. 03/25/16  Yes Everlene Farrier, PA-C  rOPINIRole (REQUIP) 1 MG tablet Take 1 tablet (1 mg total) by mouth at bedtime. 09/08/16  Yes Michele Mcalpine, MD  meloxicam (MOBIC) 15 MG tablet Take 1 tablet (15 mg total) by mouth daily. 02/20/18   York Spaniel, MD     No Known Allergies  ROS:  Out of a complete  14 system review of symptoms, the patient complains only of the following symptoms, and all other reviewed systems are negative.  Not enough sleep, decreased energy  Blood pressure 122/79, pulse (!) 57, height 6\' 2"  (1.88 m), weight 247 lb 8 oz (112.3 kg).  Physical Exam  General: The patient is alert and cooperative at the time of the examination.  The patient is moderately obese.  Eyes: Pupils are equal, round, and reactive to light. Discs are flat bilaterally.  Neck: The neck is supple, no carotid bruits are noted.  Respiratory: The respiratory examination is clear.  Cardiovascular: The cardiovascular examination reveals a regular rate and rhythm, no obvious murmurs or rubs are noted.  Neuromuscular: Range of movement of the lumbar spine is full.  No significant discomfort with palpation  of the low back is noted.  Skin: Extremities are without significant edema.  Neurologic Exam  Mental status: The patient is alert and oriented x 3 at the time of the examination. The patient has apparent normal recent and remote memory, with an apparently normal attention span and concentration ability.  Cranial nerves: Facial symmetry is present. There is good sensation of the face to pinprick and soft touch bilaterally. The strength of the facial muscles and the muscles to head turning and shoulder shrug are normal bilaterally. Speech is well enunciated, no aphasia or dysarthria is noted. Extraocular movements are full. Visual fields are full. The tongue is midline, and the patient has symmetric elevation of the soft palate. No obvious hearing deficits are noted.  Motor: The motor testing reveals 5 over 5 strength of all 4 extremities. Good symmetric motor tone is noted throughout.  Sensory: Sensory testing is intact to pinprick, soft touch, vibration sensation, and position sense on all 4 extremities. No evidence of extinction is noted.  Coordination: Cerebellar testing reveals good finger-nose-finger and heel-to-shin bilaterally.  Gait and station: Gait is normal. Tandem gait is normal. Romberg is negative. No drift is seen.  The patient is able to walk on heels and the toes bilaterally.  Reflexes: Deep tendon reflexes are symmetric and normal bilaterally. Toes are downgoing bilaterally.   Assessment/Plan:  1.  Chronic low back pain, left thigh discomfort  The history given by the patient appears to be most consistent with a facet joint arthritis issue with some pain referred into the left leg.  The patient will be placed on Mobic taking 15 mg daily, he will do this for about 2 months.  I will get him set up for an x-ray of the low back.  The patient will try to engage in a regular stretching exercise program and try to lose weight.  He will follow-up in about 4 months.  We may consider  MRI of the lumbar spine in the future if he is not improving with his symptoms.   David Palau. Keith Willis MD 02/20/2018 8:55 AM  [facility] [address] [place], [place] [id number]  Phone [phone] Fax [phone]

## 2018-02-27 ENCOUNTER — Ambulatory Visit: Payer: BLUE CROSS/BLUE SHIELD | Admitting: Pulmonary Disease

## 2018-02-27 ENCOUNTER — Ambulatory Visit
Admission: RE | Admit: 2018-02-27 | Discharge: 2018-02-27 | Disposition: A | Discharge status: Home / Self Care | Payer: BLUE CROSS/BLUE SHIELD | Source: Ambulatory Visit | Attending: Neurology | Admitting: Neurology

## 2018-02-27 ENCOUNTER — Telehealth: Payer: Self-pay | Admitting: Neurology

## 2018-02-27 ENCOUNTER — Other Ambulatory Visit (INDEPENDENT_AMBULATORY_CARE_PROVIDER_SITE_OTHER): Payer: BLUE CROSS/BLUE SHIELD

## 2018-02-27 ENCOUNTER — Encounter: Payer: Self-pay | Admitting: Pulmonary Disease

## 2018-02-27 VITALS — BP 124/72 | HR 54 | Temp 97.9°F | Ht 74.0 in | Wt 246.0 lb

## 2018-02-27 DIAGNOSIS — G8929 Other chronic pain: Secondary | ICD-10-CM

## 2018-02-27 DIAGNOSIS — R5383 Other fatigue: Secondary | ICD-10-CM

## 2018-02-27 DIAGNOSIS — Z9889 Other specified postprocedural states: Secondary | ICD-10-CM

## 2018-02-27 DIAGNOSIS — Z0000 Encounter for general adult medical examination without abnormal findings: Secondary | ICD-10-CM | POA: Diagnosis not present

## 2018-02-27 DIAGNOSIS — F341 Dysthymic disorder: Secondary | ICD-10-CM

## 2018-02-27 DIAGNOSIS — G4733 Obstructive sleep apnea (adult) (pediatric): Secondary | ICD-10-CM

## 2018-02-27 DIAGNOSIS — E663 Overweight: Secondary | ICD-10-CM

## 2018-02-27 DIAGNOSIS — M5442 Lumbago with sciatica, left side: Principal | ICD-10-CM

## 2018-02-27 DIAGNOSIS — G2581 Restless legs syndrome: Secondary | ICD-10-CM

## 2018-02-27 LAB — CBC WITH DIFFERENTIAL/PLATELET
Basophils Absolute: 0.1 10*3/uL (ref 0.0–0.1)
Basophils Relative: 1.1 % (ref 0.0–3.0)
EOS ABS: 0.3 10*3/uL (ref 0.0–0.7)
EOS PCT: 2.8 % (ref 0.0–5.0)
HEMATOCRIT: 45.9 % (ref 39.0–52.0)
HEMOGLOBIN: 15.8 g/dL (ref 13.0–17.0)
Lymphocytes Relative: 22 % (ref 12.0–46.0)
Lymphs Abs: 2.5 10*3/uL (ref 0.7–4.0)
MCHC: 34.4 g/dL (ref 30.0–36.0)
MCV: 85.1 fl (ref 78.0–100.0)
MONO ABS: 0.8 10*3/uL (ref 0.1–1.0)
Monocytes Relative: 6.8 % (ref 3.0–12.0)
Neutro Abs: 7.8 10*3/uL — ABNORMAL HIGH (ref 1.4–7.7)
Neutrophils Relative %: 67.3 % (ref 43.0–77.0)
Platelets: 213 10*3/uL (ref 150.0–400.0)
RBC: 5.39 Mil/uL (ref 4.22–5.81)
RDW: 13 % (ref 11.5–15.5)
WBC: 11.5 10*3/uL — AB (ref 4.0–10.5)

## 2018-02-27 LAB — COMPREHENSIVE METABOLIC PANEL
ALK PHOS: 57 U/L (ref 39–117)
ALT: 15 U/L (ref 0–53)
AST: 16 U/L (ref 0–37)
Albumin: 4.7 g/dL (ref 3.5–5.2)
BILIRUBIN TOTAL: 0.5 mg/dL (ref 0.2–1.2)
BUN: 14 mg/dL (ref 6–23)
CO2: 30 mEq/L (ref 19–32)
Calcium: 9.7 mg/dL (ref 8.4–10.5)
Chloride: 103 mEq/L (ref 96–112)
Creatinine, Ser: 0.92 mg/dL (ref 0.40–1.50)
GFR: 95.44 mL/min (ref >60.00)
GLUCOSE: 87 mg/dL (ref 70–99)
Potassium: 3.8 mEq/L (ref 3.5–5.1)
SODIUM: 139 meq/L (ref 135–145)
TOTAL PROTEIN: 7.5 g/dL (ref 6.0–8.3)

## 2018-02-27 LAB — TESTOSTERONE: TESTOSTERONE: 313.97 ng/dL (ref 300.00–890.00)

## 2018-02-27 LAB — TSH: TSH: 1.27 u[IU]/mL (ref 0.35–4.50)

## 2018-02-27 MED ORDER — SILDENAFIL CITRATE 20 MG PO TABS: ORAL_TABLET | ORAL | 5 refills | Status: DC

## 2018-02-27 NOTE — Telephone Encounter (Signed)
I called the patient.  There is some evidence of degenerative disc disease and facet joint arthritis at the L5-S1 level, this is likely the cause of his discomfort.  I discussed this with him.   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

## 2018-02-27 NOTE — Patient Instructions (Signed)
Today we updated your med list in our EPIC system...     We discussed checking some blood work for further evaluation of your fatigue...    We will contact you w/ the results when available...   Try the "tennis ball trick" to stay off your back while sleeping...    You rest better & get to the deep stages of sleep when you sleep on your side...  We wrote a new prescription for generic Viagra= SILDENAFIL 20mg  tabs to use as needed...    The directions read "take 1-5 tabs as needed" & I rec starting w/ 1-2 tabs to see if it helps... Then increase to 3-4-5 tabs if these larger doses are required for the desired results...  (NOTE: insurance coverage is spotty for these meds- try the cash price from [facility]...  Call for any questions.Marland Kitchen..Marland Kitchen

## 2018-02-28 ENCOUNTER — Encounter: Payer: Self-pay | Admitting: Pulmonary Disease
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
## 2018-03-12 ENCOUNTER — Telehealth: Payer: Self-pay | Admitting: Neurology

## 2018-03-12 NOTE — Telephone Encounter (Signed)
OK to fill out FMLA

## 2018-03-12 NOTE — Telephone Encounter (Signed)
Pt will drop off FMLA forms. He is missing work due to his back.   FYI

## 2018-03-12 NOTE — Telephone Encounter (Signed)
Noted. Waiting on forms from pt.

## 2018-03-12 NOTE — Telephone Encounter (Signed)
Dr. Anne HahnWillis- are you agreeable to fill out FMLA for him?

## 2018-06-25 ENCOUNTER — Ambulatory Visit: Payer: BLUE CROSS/BLUE SHIELD | Admitting: Neurology

## 2018-06-25 ENCOUNTER — Encounter: Payer: Self-pay | Admitting: Neurology

## 2018-06-25 VITALS — BP 140/86 | HR 65 | Ht 74.0 in | Wt 249.0 lb

## 2018-06-25 DIAGNOSIS — G8929 Other chronic pain: Secondary | ICD-10-CM | POA: Diagnosis not present

## 2018-06-25 DIAGNOSIS — M5442 Lumbago with sciatica, left side: Secondary | ICD-10-CM

## 2018-06-25 NOTE — Progress Notes (Signed)
Reason for visit: Chronic low back pain  David Gamble is an 42 y.o. male  History of present illness:  David Gamble is a 42 year old left-handed white male with a history of obesity and chronic low back pain.  The patient has had x-rays of the low back that showed evidence of facet joint arthritis bilaterally at the L5-S1 level.  This is felt to be the source of his discomfort.  The patient continues to have flareups of back pain, he has missed work on occasion.  He is concerned about his job in this regard, he is considering getting FMLA for his work.  The patient has discomfort that goes from the left flank down into the left buttocks and down the left thigh to above the knee.  He indicates that prolonged sitting will worsen the pain, he tries to do stretching exercises.  His job requires intermittent lifting of 20 pound objects associated bending and stooping which seems to aggravate his back pain.  The patient comes to this office for an evaluation.  Past Medical History:  Diagnosis Date  . Heart burn   . Hemorrhoids   . Overweight(278.02)   . RLS (restless legs syndrome)   . Sleep apnea    uses CPAP, followed by Dr Shelle Iron  . Tonsillar hypertrophy     Past Surgical History:  Procedure Laterality Date  . FINGER SURGERY  2005   ORIF of 5th digit L hand  . TONSILLECTOMY  2009    Family History  Problem Relation Age of Onset  . COPD Unknown   . Lung cancer Unknown   . Cirrhosis Unknown   . Hypertension Mother     Social history:  reports that he has never smoked. He has never used smokeless tobacco. He reports that he does not drink alcohol or use drugs.   No Known Allergies  Medications:  Prior to Admission medications   Medication Sig Start Date End Date Taking? Authorizing Provider  ibuprofen (ADVIL,MOTRIN) 800 MG tablet Take 1 tablet (800 mg total) by mouth 3 (three) times daily with meals as needed for mild pain or moderate pain. 03/31/17  Yes Caccavale, Sophia,  PA-C  MELATONIN PO Take by mouth. At bedtime   Yes [provider]  meloxicam (MOBIC) 15 MG tablet Take 1 tablet (15 mg total) by mouth daily. 02/20/18  Yes York Spaniel, MD  sildenafil (REVATIO) 20 MG tablet Take 1-5 tabs as directed 02/27/18  Yes Michele Mcalpine, MD    ROS:  Out of a complete 14 system review of symptoms, the patient complains only of the following symptoms, and all other reviewed systems are negative.  Back pain  Blood pressure 140/86, pulse 65, height 6\' 2"  (1.88 m), weight 249 lb (112.9 kg).  Physical Exam  General: The patient is alert and cooperative at the time of the examination.  The patient is markedly obese.  Neuromuscular: The patient has good range of movement the low back.  Skin: No significant peripheral edema is noted.   Neurologic Exam  Mental status: The patient is alert and oriented x 3 at the time of the examination. The patient has apparent normal recent and remote memory, with an apparently normal attention span and concentration ability.   Cranial nerves: Facial symmetry is present. Speech is normal, no aphasia or dysarthria is noted. Extraocular movements are full. Visual fields are full.  Motor: The patient has good strength in all 4 extremities.  Sensory examination: Soft touch sensation  is symmetric on the face, arms, and legs.  Coordination: The patient has good finger-nose-finger and heel-to-shin bilaterally.  Gait and station: The patient has a normal gait. Tandem gait is normal. Romberg is negative. No drift is seen.  The patient is able to walk on heels and the toes bilaterally.  Reflexes: Deep tendon reflexes are symmetric.   Assessment/Plan:  1.  Chronic low back pain  The patient likely has back pain associated with bilateral L5-S1 facet joint arthritis.  He is having flareups more frequently with his low back.  We will check MRI of the low back, if no other source of pain is identified, he will be sent for a  facet joint injection at the L5-S1 level on the left.  The patient will follow-up in 6 months.  He is encouraged to lose weight.  Marlan Palau MD 06/25/2018 10:02 AM  [facility] [address] [place], [place] [address]  Phone [phone] Fax [phone]

## 2018-06-26 ENCOUNTER — Telehealth: Payer: Self-pay | Admitting: Neurology

## 2018-06-26 NOTE — Telephone Encounter (Signed)
[employer] pt has [facility] they will reach out to the pt to schedule. lvm for pt to be  aware of the US [facility] and some one will reach out to him to schedule from [facility].

## 2018-07-03 ENCOUNTER — Encounter: Payer: Self-pay | Admitting: Neurology

## 2018-07-05 ENCOUNTER — Telehealth: Payer: Self-pay | Admitting: Neurology

## 2018-07-05 DIAGNOSIS — M545 Low back pain, unspecified: Secondary | ICD-10-CM

## 2018-07-05 DIAGNOSIS — G8929 Other chronic pain: Secondary | ICD-10-CM

## 2018-07-05 NOTE — Telephone Encounter (Signed)
I called the patient.  The MRI of the lumbar spine overall looks fairly good, no evidence of spinal stenosis or nerve root impingement at any level.  The patient does have some facet joint arthritis that is most significant at L5-S1 level, I will have a facet joint injection at this level set up.  The patient is amenable to this.    MRI lumbar 07/03/18:  1.  Lumbar spondylosis most significant at L5-S1 with facet arthropathy and disc osteophyte complex causing mild bilateral neural foraminal narrowing.  No significant central canal stenosis.

## 2018-07-09 ENCOUNTER — Other Ambulatory Visit: Payer: Self-pay | Admitting: Neurology

## 2018-07-09 DIAGNOSIS — M545 Low back pain, unspecified: Secondary | ICD-10-CM

## 2018-07-09 DIAGNOSIS — G8929 Other chronic pain: Secondary | ICD-10-CM

## 2018-12-16 ENCOUNTER — Telehealth: Payer: Self-pay

## 2018-12-16 NOTE — Telephone Encounter (Signed)
Unable to get in contact with the patient. I left a voicemail explaining that their appointment has been cancelled with Sarah Slack, NP on 12/17/2018. I offered a telephone visit but I must have verbal consent before I can schedule it. Office number provided.   

## 2018-12-17 ENCOUNTER — Ambulatory Visit (INDEPENDENT_AMBULATORY_CARE_PROVIDER_SITE_OTHER): Payer: BLUE CROSS/BLUE SHIELD | Admitting: Neurology

## 2018-12-17 ENCOUNTER — Encounter: Payer: Self-pay | Admitting: Neurology

## 2018-12-17 ENCOUNTER — Other Ambulatory Visit: Payer: Self-pay

## 2018-12-17 DIAGNOSIS — G8929 Other chronic pain: Secondary | ICD-10-CM | POA: Diagnosis not present

## 2018-12-17 DIAGNOSIS — M545 Low back pain: Secondary | ICD-10-CM

## 2018-12-17 MED ORDER — DICLOFENAC SODIUM 1 % TD GEL: 2.0000 g | Freq: Two times a day (BID) | TRANSDERMAL | 3 refills | Status: DC

## 2018-12-17 NOTE — Progress Notes (Signed)
I have read the note, and I agree with the clinical assessment and plan.  Lilyanne Mcquown K Draeden Kellman   

## 2018-12-17 NOTE — Telephone Encounter (Signed)
I called the patient to schedule a virtual visit. I left a message. He asked for a call back after 2:30, it is 2:47.

## 2018-12-17 NOTE — Telephone Encounter (Signed)
Pt has called in response to message from CMA. Pt is asking for a call back.  Pt is asking for a call back after 2:30pm

## 2018-12-17 NOTE — Progress Notes (Signed)
Virtual Visit via Telephone Note  I connected with David Gamble on 12/17/18 at  3:45 PM EDT by telephone and verified that I am speaking with the correct person using two identifiers.   I discussed the limitations, risks, security and privacy concerns of performing an evaluation and management service by telephone and the availability of in person appointments. I also discussed with the patient that there may be a patient responsible charge related to this service. The patient expressed understanding and agreed to proceed.   History of Present Illness: December 16, 2018 SS: [name] is a 43 year old male with history of chronic low back pain.  He had an x-ray that revealed bilateral L5-S1 facet joint arthritis.  He had MRI of his lumbar spine in October 2019, no evidence of spinal stenosis or nerve root impingement at any level.  He was set up for a facet joint injection.  He never had the injection, he reports he has had 2 in the past and they were not beneficial.  He also reports a dislike for needles.  He reports some days his back feels good, some days he will have bad days.  His back pain is mostly exacerbated by standing.  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  He is only able to sit down at his break.  He mentioned possibly needing FMLA papers.  It is his low back that bothers him.  He reports he has been trying to lose weight, diet, stretching.  He does report that there are days that his back does not bother him.  He denies any new problems or concerns.   06/25/2018 Dr. Anne Hahn: David Gamble is a 43 year old left-handed white male with a history of obesity and chronic low back pain.  The patient has had x-rays of the low back that showed evidence of facet joint arthritis bilaterally at the L5-S1 level.  This is felt to be the source of his discomfort.  The patient continues to have flareups of back pain, he has missed work on occasion.   He is concerned about his job in this regard, he is considering getting FMLA for his work.  The patient has discomfort that goes from the left flank down into the left buttocks and down the left thigh to above the knee.  He indicates that prolonged sitting will worsen the pain, he tries to do stretching exercises.  His job requires intermittent lifting of 20 pound objects associated bending and stooping which seems to aggravate his back pain.  The patient comes to this office for an evaluation.   Observations/Objective: Alert, knowledgeable of health condition, answers questions appropriately  Assessment and Plan: 1.  Chronic low back pain  I will prescribe Voltaren 1% gel, can apply twice daily to lower back as needed.  He thinks this will be beneficial to apply to his back before work.  We discussed his FMLA for work, he reports his current job has a point system and there are some days where he feels like he needs to go home because his back is hurting.  His current position does not allow him to sit down.  We will hold off on paperwork for now.  We discussed in the future he may need to put in for a transfer to a position where he is able to sit at least intermittently during the day.  He is agreeable to this plan.  He never  got the facet joint injection.  He will continue exercising, trying to lose weight, stretching to help with chronic low back pain.  He will follow-up in 3-month visit.  I advised him that if his symptoms worsen or if he develops any new symptoms he should let us know.  Follow Up Instructions: 6 months for a revisit    I discussed the assessment and treatment plan with the patient. The patient was provided an opportunity to ask questions and all were answered. The patient agreed with the plan and demonstrated an understanding of the instructions.   The patient was advised to call back or seek an in-person evaluation if the symptoms worsen or if the condition fails to improve  as anticipated.  I provided 30 minutes of non-face-to-face time during this encounter.   Otila Kluver, DNP  [facility] [address], [address] [place], [place] [address] [phone]

## 2018-12-19 ENCOUNTER — Telehealth: Payer: Self-pay

## 2018-12-19 NOTE — Telephone Encounter (Signed)
Pending approval for Diclofenac Sodium 1% Gel Key: [id number] Rx #: [id number] ICD 10 code: M54.5  I will update once a decision has been made

## 2018-12-23 NOTE — Telephone Encounter (Signed)
Patient doesn't have active coverage with [employer] which is why his PA was cancelled.    Called the patient unable to leave a voicemail. I will attempt to call the patient later on today.

## 2018-12-30 NOTE — Telephone Encounter (Signed)
Spoke with the [facility] on the patient's file and I was able to get his insurance information.   Pending approval for Diclofenac Sodium % Gel Key: [id number] Rx #: [id number] PA CASE ID: [id number] ICD 10 Codes: M54.5 and G89.29   I will update once a decision has been made.

## 2018-12-30 NOTE — Telephone Encounter (Signed)
Received approval for Voltaren Gel Approved from 12/30/2018 through 12/30/2019. A copy of the approval letter has been faxed to the pharmacy listed below. Confirmation fax has been received.   [id number] - [place], [place] - [address] [phone] (Phone) [phone] (Fax)

## 2019-01-28 ENCOUNTER — Telehealth: Payer: Self-pay

## 2019-01-28 NOTE — Telephone Encounter (Signed)
I called pt, scheduled his f/u per SS, NP's recommendations for 06/18/19. Pt verbalized understanding of new appt date and time.

## 2019-06-17 NOTE — Progress Notes (Signed)
PATIENT: David Gamble DOB: [birth year]  REASON FOR VISIT: follow up HISTORY FROM: patient  HISTORY OF PRESENT ILLNESS: Today 06/18/19  David Gamble is a 43 year old male with history of chronic low back pain.  He has had MRI of his lumbar spine that did not show evidence of spinal stenosis or nerve root impingement at any level.  He does have some facet joint arthritis most significant at L5-S1 level.  He was set up for a facet joint injection at the L5-S1 level, but he decided not to have it completed.  He indicates he continues to have flares of pain, in his low back, radiating down his right leg.  This is worsened by his job.  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  He is planning to request a job transfer in December, where he is able to sit down.  His current job does not allow for him to take any breaks sitting.  He is prescribed Voltaren gel.  This is beneficial for flares.  A few months ago he started working out with a trainer and he was able to lose some weight.  He reports his weight loss significantly reduced his pain.  Unfortunately, with COVID-19 he has gained the weight back.  He denies any numbness or weakness in his arms or legs.  He denies any changes to his bowels or bladder.  He presents today for follow-up unaccompanied.  HISTORY  December 16, 2018 SS: David Gamble is a 43 year old male with history of chronic low back pain.  He had an x-ray that revealed bilateral L5-S1 facet joint arthritis.  He had MRI of his lumbar spine in October 2019, no evidence of spinal stenosis or nerve root impingement at any level.  He was set up for a facet joint injection.  He never had the injection, he reports he has had 2 in the past and they were not beneficial.  He also reports a dislike for needles.  He reports some days his back feels good, some days he will have bad days.  His back pain is mostly exacerbated by  standing.  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  He is only able to sit down at his break.  He mentioned possibly needing FMLA papers.  It is his low back that bothers him.  He reports he has been trying to lose weight, diet, stretching.  He does report that there are days that his back does not bother him.  He denies any new problems or concerns.  06/25/2018 Dr. Anne Hahn: David Gamble is a 43 year old left-handed white male with a history of obesity and chronic low back pain. The patient has had x-rays of the low back that showed evidence of facet joint arthritis bilaterally at the L5-S1 level. This is felt to be the source of his discomfort. The patient continues to have flareups of back pain, he has missed work on occasion. He is concerned about his job in this regard, he is considering getting FMLA for his work. The patient has discomfort that goes from the left flank down into the left buttocks and down the left thigh to above the knee. He indicates that prolonged sitting will worsen the pain, he tries to do stretching exercises. Hisjob requires intermittent lifting of 20 pound objects associated bending and stooping which seems to  aggravate his back pain. The patient comes to this office for an evaluation.  REVIEW OF SYSTEMS: Out of a complete 14 system review of symptoms, the patient complains only of the following symptoms, and all other reviewed systems are negative.  Back pain  ALLERGIES: No Known Allergies  HOME MEDICATIONS: Outpatient Medications Prior to Visit  Medication Sig Dispense Refill  . ibuprofen (ADVIL,MOTRIN) 800 MG tablet Take 1 tablet (800 mg total) by mouth 3 (three) times daily with meals as needed for mild pain or moderate pain. 30 tablet 0  . MELATONIN PO Take by mouth. At bedtime    . meloxicam (MOBIC) 15 MG tablet Take 1 tablet (15 mg total) by mouth daily. 30 tablet 4  . clobetasol (TEMOVATE) 0.05 %  external solution Apply topically.    . diclofenac sodium (VOLTAREN) 1 % GEL Apply 2 g topically 2 (two) times daily. (Patient not taking: Reported on 06/18/2019) 1 Tube 3  . MELATONIN PO Take by mouth.    . sildenafil (REVATIO) 20 MG tablet Take 1-5 tabs as directed (Patient not taking: Reported on 06/18/2019) 50 tablet 5  . tadalafil (CIALIS) 20 MG tablet Take by mouth.     No facility-administered medications prior to visit.     PAST MEDICAL HISTORY: Past Medical History:  Diagnosis Date  . Heart burn   . Hemorrhoids   . Overweight(278.02)   . RLS (restless legs syndrome)   . Sleep apnea    uses CPAP, followed by Dr Gwenette Greet  . Tonsillar hypertrophy     PAST SURGICAL HISTORY: Past Surgical History:  Procedure Laterality Date  . FINGER SURGERY  2005   ORIF of 5th digit L hand  . TONSILLECTOMY  2009    FAMILY HISTORY: Family History  Problem Relation Age of Onset  . COPD Unknown   . Lung cancer Unknown   . Cirrhosis Unknown   . Hypertension Mother     SOCIAL HISTORY: Social History   Socioeconomic History  . Marital status: Married    Spouse name: karen  . Number of children: 1  . Years of education: Not on file  . Highest education level: Not on file  Occupational History  . Occupation: truck Systems analyst area  Social Needs  . Financial resource strain: Not on file  . Food insecurity    Worry: Not on file    Inability: Not on file  . Transportation needs    Medical: Not on file    Non-medical: Not on file  Tobacco Use  . Smoking status: Never Smoker  . Smokeless tobacco: Never Used  Substance and Sexual Activity  . Alcohol use: No  . Drug use: No  . Sexual activity: Not on file  Lifestyle  . Physical activity    Days per week: Not on file    Minutes per session: Not on file  . Stress: Not on file  Relationships  . Social Herbalist on phone: Not on file    Gets together: Not on file    Attends religious service: Not on file    Active  member of club or organization: Not on file    Attends meetings of clubs or organizations: Not on file    Relationship status: Not on file  . Intimate partner violence    Fear of current or ex partner: Not on file    Emotionally abused: Not on file    Physically abused: Not on file    Forced sexual activity:  Not on file  Other Topics Concern  . Not on file  Social History Narrative   [place]ives in [place] with [place].  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            PHYSICAL EXAM  Vitals:   06/18/19 1043  BP: (!) 147/91  Pulse: 66  Temp: 97.8 F (36.6 C)  Weight: 247 lb (112 kg)   Body mass index is 31.71 kg/m.  Generalized: Well developed, in no acute distress   Neurological examination  Mentation: Alert oriented to time, place, history taking. Follows all commands speech and language fluent Cranial nerve II-XII: Pupils were equal round reactive to light. Extraocular movements were full, visual field were full on confrontational test. Facial sensation and strength were normal. Uvula tongue midline. Head turning and shoulder shrug  were normal and symmetric. Motor: The motor testing reveals 5 over 5 strength of all 4 extremities. Good symmetric motor tone is noted throughout.  Sensory: Sensory testing is intact to soft touch on all 4 extremities. No evidence of extinction is noted.  Coordination: Cerebellar testing reveals good finger-nose-finger and heel-to-shin bilaterally.  Gait and station: Gait is normal. Tandem gait is normal. Romberg is negative. No drift is seen.  Reflexes: Deep tendon reflexes are symmetric and normal bilaterally.   DIAGNOSTIC DATA (LABS, IMAGING, TESTING) - I reviewed patient records, labs, notes, testing and imaging myself where available.  Lab Results  Component Value Date   WBC 11.5 (H) 02/27/2018   HGB 15.8 02/27/2018   HCT 45.9 02/27/2018   MCV 85.1 02/27/2018   PLT 213.0 02/27/2018      Component Value  Date/Time   NA 139 02/27/2018 1257   NA 138 09/27/2013 1852   K 3.8 02/27/2018 1257   K 4.1 09/27/2013 1852   CL 103 02/27/2018 1257   CL 105 09/27/2013 1852   CO2 30 02/27/2018 1257   CO2 31 09/27/2013 1852   GLUCOSE 87 02/27/2018 1257   GLUCOSE 94 09/27/2013 1852   BUN 14 02/27/2018 1257   BUN 11 09/27/2013 1852   CREATININE 0.92 02/27/2018 1257   CREATININE 1.04 09/27/2013 1852   CALCIUM 9.7 02/27/2018 1257   CALCIUM 8.7 09/27/2013 1852   PROT 7.5 02/27/2018 1257   PROT 7.4 09/27/2013 1852   ALBUMIN 4.7 02/27/2018 1257   ALBUMIN 3.9 09/27/2013 1852   AST 16 02/27/2018 1257   AST 26 09/27/2013 1852   ALT 15 02/27/2018 1257   ALT 94 (H) 09/27/2013 1852   ALKPHOS 57 02/27/2018 1257   ALKPHOS 45 09/27/2013 1852   BILITOT 0.5 02/27/2018 1257   BILITOT 0.5 09/27/2013 1852   GFRNONAA >60 09/27/2013 1852   GFRAA >60 09/27/2013 1852   Lab Results  Component Value Date   CHOL 232 (HH) 10/24/2007   HDL 32.8 (L) 10/24/2007   LDLDIRECT 164.7 10/24/2007   TRIG 123 10/24/2007   CHOLHDL 7.1 CALC 10/24/2007   No results found for: HGBA1C No results found for: VITAMINB12 Lab Results  Component Value Date   TSH 1.27 02/27/2018    ASSESSMENT AND PLAN 43 y.o. year old male  has a past medical history of Heart burn, Hemorrhoids, Overweight(278.02), RLS (restless legs syndrome), Sleep apnea, and Tonsillar hypertrophy. here with:  1.  Chronic low back pain  He continues to complain of low back pain, radiating down his right leg (has complained of the left in the past).  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  He plans to request a job transfer in December, to a role where he is allowed to sit.  He thinks this will help to reduce his back pain and flares.  I will reorder meloxicam 15 mg daily for 2 months.  I will check kidney function today since starting meloxicam.  He is not interested in getting a facet joint injection, as was  ordered in the past for L5-S1 arthritis.  I have encouraged him to get back to exercising, healthy eating to promote weight loss.  He needs to reestablish with a primary care provider, as his recently retired.  He will follow-up in 6 months or sooner if needed.  I did advise if symptoms worsen or if he develops any new symptoms he should let us know.  I spent 15 minutes with the patient. 50% of this time was spent discussing his plan of care.    Margie Ege, AGNP-C, DNP 06/18/2019, 10:49 AM [facility] [address] [place], [place] [address] [phone]

## 2019-06-18 ENCOUNTER — Encounter: Payer: Self-pay | Admitting: Neurology

## 2019-06-18 ENCOUNTER — Ambulatory Visit: Payer: BC Managed Care – PPO | Admitting: Neurology

## 2019-06-18 ENCOUNTER — Other Ambulatory Visit: Payer: Self-pay

## 2019-06-18 VITALS — BP 147/91 | HR 66 | Temp 97.8°F | Wt 247.0 lb

## 2019-06-18 DIAGNOSIS — G8929 Other chronic pain: Secondary | ICD-10-CM

## 2019-06-18 DIAGNOSIS — M5442 Lumbago with sciatica, left side: Secondary | ICD-10-CM | POA: Diagnosis not present

## 2019-06-18 DIAGNOSIS — R531 Weakness: Secondary | ICD-10-CM

## 2019-06-18 DIAGNOSIS — M5441 Lumbago with sciatica, right side: Secondary | ICD-10-CM | POA: Diagnosis not present

## 2019-06-18 DIAGNOSIS — M545 Low back pain, unspecified: Secondary | ICD-10-CM | POA: Insufficient documentation

## 2019-06-18 MED ORDER — MELOXICAM 15 MG PO TABS: 15.0000 mg | ORAL_TABLET | Freq: Every day | ORAL | 3 refills | Status: DC

## 2019-06-18 NOTE — Patient Instructions (Addendum)
1. Start meloxicam 15 mg daily x 2 months 2. Check kidney function today  3. Start exercising, try to lose weight 4. Reestablish with primary care doctor

## 2019-06-18 NOTE — Progress Notes (Signed)
I have read the note, and I agree with the clinical assessment and plan.  Casimira Sutphin K Alleya Demeter   

## 2019-06-19 ENCOUNTER — Telehealth: Payer: Self-pay | Admitting: *Deleted

## 2019-06-19 LAB — BASIC METABOLIC PANEL
BUN/Creatinine Ratio: 23 — ABNORMAL HIGH (ref 9–20)
BUN: 24 mg/dL (ref 6–24)
CO2: 21 mmol/L (ref 20–29)
Calcium: 9.3 mg/dL (ref 8.7–10.2)
Chloride: 108 mmol/L — ABNORMAL HIGH (ref 96–106)
Creatinine, Ser: 1.03 mg/dL (ref 0.76–1.27)
GFR calc Af Amer: 102 mL/min/{1.73_m2} (ref >59)
GFR calc non Af Amer: 88 mL/min/{1.73_m2} (ref >59)
Glucose: 101 mg/dL — ABNORMAL HIGH (ref 65–99)
Potassium: 4.2 mmol/L (ref 3.5–5.2)
Sodium: 143 mmol/L (ref 134–144)

## 2019-06-19 NOTE — Telephone Encounter (Signed)
LVM advising the patient his labs look stable. The NP checked kidney function before starting meloxicam, and it looks good. He can continue meloxicam, but hold off on using the diclofenac cream. Left number for questions.

## 2019-07-31 ENCOUNTER — Telehealth: Payer: Self-pay | Admitting: Neurology

## 2019-07-31 MED ORDER — GABAPENTIN 300 MG PO CAPS: ORAL_CAPSULE | ORAL | 3 refills | Status: DC

## 2019-07-31 NOTE — Telephone Encounter (Signed)
I called pt and he stated he has missed 3 days due to back pain (he stated he had 6.5 points (9  Is max).  He stated that the meloxicam did not really work.  His job is done by bid (seniority) and he did not know when this might occur for him.  He was not interested in getting injection in his back.

## 2019-07-31 NOTE — Addendum Note (Signed)
Addended by: Suzzanne Cloud on: 07/31/2019 01:27 PM   Modules accepted: Orders

## 2019-07-31 NOTE — Telephone Encounter (Signed)
I called the patient. He reports continued pain, low back, radiating down his right leg. His job is not helping, worsened because he has to push the pedal. I offered muscle relaxer, he has tried those, doesn't want them. He can try gabapentin 300 mg at bedtime. He isn't interested in getting an injection.

## 2019-07-31 NOTE — Telephone Encounter (Signed)
Pt has called to inform that meloxicam (MOBIC) 15 MG tablet has not worked for him.  Please call

## 2019-10-13 ENCOUNTER — Other Ambulatory Visit: Payer: Self-pay

## 2019-10-13 ENCOUNTER — Ambulatory Visit: Payer: BC Managed Care – PPO | Admitting: Neurology

## 2019-10-13 ENCOUNTER — Encounter: Payer: Self-pay | Admitting: Neurology

## 2019-10-13 VITALS — BP 163/101 | HR 72 | Temp 96.5°F | Ht 74.0 in | Wt 255.4 lb

## 2019-10-13 DIAGNOSIS — G4733 Obstructive sleep apnea (adult) (pediatric): Secondary | ICD-10-CM

## 2019-10-13 DIAGNOSIS — R400 Somnolence: Secondary | ICD-10-CM | POA: Diagnosis not present

## 2019-10-13 DIAGNOSIS — M5442 Lumbago with sciatica, left side: Secondary | ICD-10-CM

## 2019-10-13 DIAGNOSIS — M5441 Lumbago with sciatica, right side: Secondary | ICD-10-CM | POA: Diagnosis not present

## 2019-10-13 DIAGNOSIS — G8929 Other chronic pain: Secondary | ICD-10-CM

## 2019-10-13 NOTE — Patient Instructions (Addendum)
I will send you for a sleep evaluation, I am glad your back pain has improved, continue exercise, and weight loss plan, Please find a primary doctor   Return in 6 months

## 2019-10-13 NOTE — Progress Notes (Signed)
PATIENT: David Gamble DOB: 12/05/1975  REASON FOR VISIT: follow up HISTORY FROM: patient  HISTORY OF PRESENT ILLNESS: Today 10/13/19  David Gamble is a 44 year old male with history of chronic low back pain.  He has had MRI of his lumbar spine that did not show evidence of spinal stenosis or nerve root impingement at any level.  He does have some facet joint arthritis but most significant L5-S1.  He was set up for a facet joint injection, but decided not to have it completed.  He reports his low back pain has improved, he was able to transfer jobs, is now working in the Programmer, multimedia.  When he was stationary, the pain is more bothersome, now that he moves around more with his job, the pain has not been an issue.  He did not take gabapentin, he says he takes meloxicam as needed.  He has continued consistent exercise but has not been able to lose weight.  He reports trouble sleeping, says that he goes to sleep at 830, wakes up several times during the night, before he gets up at 3:30 AM to go to the gym.  He reports snoring, daytime fatigue, feels like he could fall asleep while doing his job.  15 years ago he had a sleep study, reported OSA, was using CPAP.  He had a repeat sleep study 8 years ago, said it did not show OSA.  He is currently taking melatonin.  Since last seen 4 months ago, he has gained about 8 pounds.  His blood pressure is elevated today, says he took a preworkout supplement.  He presents today for evaluation unaccompanied.  HISTORY 06/18/2019 SS: David Gamble is a 44 year old male with history of chronic low back pain.  He has had MRI of his lumbar spine that did not show evidence of spinal stenosis or nerve root impingement at any level.  He does have some facet joint arthritis most significant at L5-S1 level.  He was set up for a facet joint injection at the L5-S1 level, but he decided not to have it completed.  He indicates he continues to have flares of pain, in his low  back, radiating down his right leg.  This is worsened by his job.  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  He is planning to request a job transfer in December, where he is able to sit down.  His current job does not allow for him to take any breaks sitting.  He is prescribed Voltaren gel.  This is beneficial for flares.  A few months ago he started working out with a trainer and he was able to lose some weight.  He reports his weight loss significantly reduced his pain.  Unfortunately, with COVID-19 he has gained the weight back.  He denies any numbness or weakness in his arms or legs.  He denies any changes to his bowels or bladder.  He presents today for follow-up unaccompanied  REVIEW OF SYSTEMS: Out of a complete 14 system review of symptoms, the patient complains only of the following symptoms, and all other reviewed systems are negative.  Back pain, fatigue  ALLERGIES: No Known Allergies  HOME MEDICATIONS: Outpatient Medications Prior to Visit  Medication Sig Dispense Refill  . ibuprofen (ADVIL,MOTRIN) 800 MG tablet Take 1 tablet (800 mg total) by mouth 3 (three) times daily with meals as needed for mild pain or moderate  pain. 30 tablet 0  . MELATONIN PO Take by mouth. At bedtime    . meloxicam (MOBIC) 15 MG tablet Take 1 tablet (15 mg total) by mouth daily. 30 tablet 3  . gabapentin (NEURONTIN) 300 MG capsule Take I tablet at bedtime (Patient not taking: Reported on 10/13/2019) 30 capsule 3   No facility-administered medications prior to visit.    PAST MEDICAL HISTORY: Past Medical History:  Diagnosis Date  . Heart burn   . Hemorrhoids   . Overweight(278.02)   . RLS (restless legs syndrome)   . Sleep apnea    uses CPAP, followed by Dr Shelle Iron  . Tonsillar hypertrophy     PAST SURGICAL HISTORY: Past Surgical History:  Procedure Laterality Date  . FINGER SURGERY  2005   ORIF of 5th digit L hand  .  TONSILLECTOMY  2009    FAMILY HISTORY: Family History  Problem Relation Age of Onset  . COPD Unknown   . Lung cancer Unknown   . Cirrhosis Unknown   . Hypertension Mother     SOCIAL HISTORY: Social History   Socioeconomic History  . Marital status: Married    Spouse name: karen  . Number of children: 1  . Years of education: Not on file  . Highest education level: Not on file  Occupational History  . Occupation: truck driver--local area  Tobacco Use  . Smoking status: Never Smoker  . Smokeless tobacco: Never Used  Substance and Sexual Activity  . Alcohol use: No  . Drug use: No  . Sexual activity: Not on file  Other Topics Concern  . Not on file  Social History Narrative   Lives in [place] with spouse.  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         Social Determinants of Health   Financial Resource Strain:   . Difficulty of Paying Living Expenses: Not on file  Food Insecurity:   . Worried About Programme researcher, broadcasting/film/video in the Last Year: Not on file  . Ran Out of Food in the Last Year: Not on file  Transportation Needs:   . Lack of Transportation (Medical): Not on file  . Lack of Transportation (Non-Medical): Not on file  Physical Activity:   . Days of Exercise per Week: Not on file  . Minutes of Exercise per Session: Not on file  Stress:   . Feeling of Stress : Not on file  Social Connections:   . Frequency of Communication with Friends and Family: Not on file  . Frequency of Social Gatherings with Friends and Family: Not on file  . Attends Religious Services: Not on file  . Active Member of Clubs or Organizations: Not on file  . Attends Banker Meetings: Not on file  . Marital Status: Not on file  Intimate Partner Violence:   . Fear of Current or Ex-Partner: Not on file  . Emotionally Abused: Not on file  . Physically Abused: Not on file  . Sexually Abused: Not on file    PHYSICAL EXAM  Vitals:   10/13/19 0819   BP: (!) 163/101  Pulse: 72  Temp: (!) 96.5 F (35.8 C)  Weight: 255 lb 6.4 oz (115.8 kg)  Height: 6\' 2"  (1.88 m)   Body mass index is 32.79 kg/m.  Generalized: Well developed, in no acute distress   Neurological examination  Mentation: Alert oriented to time, place, history taking. Follows all commands speech and language fluent Cranial nerve  II-XII: Pupils were equal round reactive to light. Extraocular movements were full, visual field were full on confrontational test. Facial sensation and strength were normal.  Head turning and shoulder shrug were normal and symmetric. Motor: The motor testing reveals 5 over 5 strength of all 4 extremities. Good symmetric motor tone is noted throughout.  Sensory: Sensory testing is intact to soft touch on all 4 extremities. No evidence of extinction is noted.  Coordination: Cerebellar testing reveals good finger-nose-finger and heel-to-shin bilaterally.  Gait and station: Gait is normal. Tandem gait is normal. Romberg is negative. No drift is seen.  Able to walk on heels and tiptoe without difficulty. Reflexes: Deep tendon reflexes are symmetric and normal bilaterally.   DIAGNOSTIC DATA (LABS, IMAGING, TESTING) - I reviewed patient records, labs, notes, testing and imaging myself where available.  Lab Results  Component Value Date   WBC 11.5 (H) 02/27/2018   HGB 15.8 02/27/2018   HCT 45.9 02/27/2018   MCV 85.1 02/27/2018   PLT 213.0 02/27/2018      Component Value Date/Time   NA 143 06/18/2019 1130   NA 138 09/27/2013 1852   K 4.2 06/18/2019 1130   K 4.1 09/27/2013 1852   CL 108 (H) 06/18/2019 1130   CL 105 09/27/2013 1852   CO2 21 06/18/2019 1130   CO2 31 09/27/2013 1852   GLUCOSE 101 (H) 06/18/2019 1130   GLUCOSE 87 02/27/2018 1257   GLUCOSE 94 09/27/2013 1852   BUN 24 06/18/2019 1130   BUN 11 09/27/2013 1852   CREATININE 1.03 06/18/2019 1130   CREATININE 1.04 09/27/2013 1852   CALCIUM 9.3 06/18/2019 1130   CALCIUM 8.7  09/27/2013 1852   PROT 7.5 02/27/2018 1257   PROT 7.4 09/27/2013 1852   ALBUMIN 4.7 02/27/2018 1257   ALBUMIN 3.9 09/27/2013 1852   AST 16 02/27/2018 1257   AST 26 09/27/2013 1852   ALT 15 02/27/2018 1257   ALT 94 (H) 09/27/2013 1852   ALKPHOS 57 02/27/2018 1257   ALKPHOS 45 09/27/2013 1852   BILITOT 0.5 02/27/2018 1257   BILITOT 0.5 09/27/2013 1852   GFRNONAA 88 06/18/2019 1130   GFRNONAA >60 09/27/2013 1852   GFRAA 102 06/18/2019 1130   GFRAA >60 09/27/2013 1852   Lab Results  Component Value Date   CHOL 232 (HH) 10/24/2007   HDL 32.8 (L) 10/24/2007   LDLDIRECT 164.7 10/24/2007   TRIG 123 10/24/2007   CHOLHDL 7.1 CALC 10/24/2007   No results found for: HGBA1C No results found for: VITAMINB12 Lab Results  Component Value Date   TSH 1.27 02/27/2018   ASSESSMENT AND PLAN 44 y.o. year old male  has a past medical history of Heart burn, Hemorrhoids, Overweight(278.02), RLS (restless legs syndrome), Sleep apnea, and Tonsillar hypertrophy. here with:  1.  Chronic low back pain 2.  Daytime drowsiness, history of obstructive sleep apnea  His back pain is currently under good control, it has much improved with a job transfer at work.  He did not take gabapentin, I will discontinue the prescription.  He may take ibuprofen as needed for back pain.  I will refer him for sleep evaluation, given history of reported OSA, recent weight gain, report of snoring, and daytime drowsiness.  He has an appointment in April to establish with a new primary provider.  I have encouraged him to continue exercise, and his weight loss plan. His BP was elevated today 163/101, after using a pre-workout supplement, I do not recommend this, these supplements can be  risky, often not well regulated.  He will follow-up here in 6 months or sooner if needed.  I did advise if his symptoms worsen or if he develops any new symptoms he should let us know.  I spent 15 minutes with the patient. 50% of this time was spent  discussing his plan of care.   Butler Denmark, AGNP-C, DNP 10/13/2019, 8:20 AM [facility] [address] [place], [place] [address] [phone]

## 2019-10-15 NOTE — Progress Notes (Signed)
I have read the note, and I agree with the clinical assessment and plan.  Volney Reierson K Auset Fritzler   

## 2019-10-20 ENCOUNTER — Encounter: Payer: Self-pay | Admitting: Neurology

## 2019-10-20 ENCOUNTER — Ambulatory Visit (INDEPENDENT_AMBULATORY_CARE_PROVIDER_SITE_OTHER): Payer: BC Managed Care – PPO | Admitting: Neurology

## 2019-10-20 ENCOUNTER — Other Ambulatory Visit: Payer: Self-pay

## 2019-10-20 VITALS — BP 145/87 | HR 97 | Temp 97.4°F | Ht 74.0 in | Wt 250.0 lb

## 2019-10-20 DIAGNOSIS — G4719 Other hypersomnia: Secondary | ICD-10-CM | POA: Diagnosis not present

## 2019-10-20 DIAGNOSIS — Z9889 Other specified postprocedural states: Secondary | ICD-10-CM | POA: Diagnosis not present

## 2019-10-20 DIAGNOSIS — E663 Overweight: Secondary | ICD-10-CM

## 2019-10-20 DIAGNOSIS — R55 Syncope and collapse: Secondary | ICD-10-CM

## 2019-10-20 DIAGNOSIS — G4733 Obstructive sleep apnea (adult) (pediatric): Secondary | ICD-10-CM

## 2019-10-20 DIAGNOSIS — G2581 Restless legs syndrome: Secondary | ICD-10-CM

## 2019-10-20 MED ORDER — MELATONIN 3 MG PO CAPS: 6.0000 mg | ORAL_CAPSULE | Freq: Every day | ORAL | 0 refills | Status: DC

## 2019-10-20 MED ORDER — ALPRAZOLAM 0.5 MG PO TABS: 0.5000 mg | ORAL_TABLET | Freq: Every evening | ORAL | 0 refills | Status: DC | PRN

## 2019-10-20 NOTE — Progress Notes (Signed)
SLEEP MEDICINE CLINIC    Provider:  Melvyn Novas, MD  Primary Care Physician:  Alroy Dust, MD    Referring Provider: Glean Salvo, Np [address] [place],  [place] [address]     Dr Lesia Sago, MD       Chief Complaint according to patient   Patient presents with:    . New Patient (Initial Visit) dr Anne Hahn via Margie Ege     Patient had a sleep study in 08-2016 which showed only mild OSA at AHI 6.7/h. he had been using CPAP up to that point. In his last visit with Margie Ege, NP , he asked for sleep aids and she referred here. (The patient gave her the impression that his last sleep study was 8 years ago !)       HISTORY OF PRESENT ILLNESS:  David Gamble is a 44 y.o. year old  Caucasian male patient seen here upon referral on 10/20/2019 - for a repeat sleep styd after gaining weight and sleeping poorly. He weighted 219 before the pandemic, but gained since. His snoring has gotten louder. RLS/ PLM patient with possible inspire needs.  He reports RLS/ PLM patient with possible inspire needs. Never liked CPAP and had UPPP to treat apnea.  Chief concern according to patient : " I was referred for sleep aids"   I have the pleasure of seeing David Gamble today, a left-handed White or Caucasian male with a possible sleep disorder. He  has a past medical history of GERD, Hemorrhoids, being Overweight(278.02), having back pain,  and Tonsillar hypertrophy.RLS/ PLM patient with possible inspire needs.  ever liked CPAP and had UPPP to treat apnea.  The patient had last sleep study in the year 2017 referred by Alroy Dust ,MD at the [facility].    with a result of an AHI ( Apnea Hypopnea index)  of 6.7, a RDI ( Respiratory Disturbance Index) was not established..    Sleep relevant medical history: status post UPPP- Nocturia/ 2-3 r times,  Restless legs, PLMs, snoring , he had a Tonsillectomy at age 44. UPPP at age 44- was followed by Marcelyn Bruins, MD .     [facility] medical Verne Spurr  history: maternal Uncle with OSA.   Social history:  Patient is working on concrete and standing all day, hanging metal onto a conveyor belt-  [employer]. and lives in a household with his fiancee.. Family status is engaged  with 44 child ( 64 year old son in the military, not living with dad)..  The patient currently works first shift -  6 Am through 4 Pm-  Tobacco use- never .  ETOH use ; never ,  Caffeine intake in form of Coffee( non) Soda( 2 / day ) Tea ( yes 3/ week) . He consumes energy drinks once a day. Regular exercise at the  gym in AM.      Sleep habits are as follows:  The patient's dinner time is between 5-6 PM. The patient goes to bed at 8.30 PM and continues to sleep until 3.30  - wakes for 2-4 bathroom breaks. These make it hard to go back to sleep. He averages only 3-4 interruptions  at night, total sleep time 6 hours.    The preferred sleep position is sideways, with the support of 3 pillows.  Dreams are reportedly rare.Marland Kitchen  3.30- AM is the usual rise time. The patient wakes up with an alarm.  He  reports  not feeling refreshed / restored in AM, with symptoms such as dry mouth, but no morning headaches, he often feels sore , and residual fatigue. Before lunch time he is sleepy again, and he takes  Nap every lunch break- 20 minutes.      Review of Systems: Out of a complete 14 system review, the patient complains of only the following symptoms, and all other reviewed systems are negative.:  Fatigue, sleepiness , snoring, fragmented sleep, NOCTURIA . RLS - he is restless all day- the irresistible urge to move when sitting or resting.    How likely are you to doze in the following situations: 0 = not likely, 1 = slight chance, 2 = moderate chance, 3 = high chance   Sitting and Reading? Watching Television? Sitting inactive in a public place (theater or meeting)? As a passenger in a car for an hour without a break? Lying down in the afternoon when circumstances permit? Sitting  and talking to someone? Sitting quietly after lunch without alcohol? In a car, while stopped for a few minutes in traffic?   Total =13/ 24 points   FSS endorsed at 50/ 63 points.   He was treated for RLS 10 years ago by Danton Sewer, MD   Social History   Socioeconomic History  . Marital status: Married    Spouse name: karen  . Number of children: 1  . Years of education: Not on file  . Highest education level: Not on file  Occupational History  . Occupation: truck driver--local area  Tobacco Use  . Smoking status: Never Smoker  . Smokeless tobacco: Never Used  Substance and Sexual Activity  . Alcohol use: No  . Drug use: No  . Sexual activity: Not on file  Other Topics Concern  . Not on file  Social History Narrative   Lives in [place] with spouse.  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         Social Determinants of Health   Financial Resource Strain:   . Difficulty of Paying Living Expenses: Not on file  Food Insecurity:   . Worried About Charity fundraiser in the Last Year: Not on file  . Ran Out of Food in the Last Year: Not on file  Transportation Needs:   . Lack of Transportation (Medical): Not on file  . Lack of Transportation (Non-Medical): Not on file  Physical Activity:   . Days of Exercise per Week: Not on file  . Minutes of Exercise per Session: Not on file  Stress:   . Feeling of Stress : Not on file  Social Connections:   . Frequency of Communication with Friends and Family: Not on file  . Frequency of Social Gatherings with Friends and Family: Not on file  . Attends Religious Services: Not on file  . Active Member of Clubs or Organizations: Not on file  . Attends Archivist Meetings: Not on file  . Marital Status: Not on file    Family History  Problem Relation Age of Onset  . COPD Unknown   . Lung cancer Unknown   . Cirrhosis Unknown   . Hypertension Mother     Past Medical History:  Diagnosis  Date  . Heart burn   . Hemorrhoids   . Overweight(278.02)   . RLS (restless legs syndrome)   . Sleep apnea    uses CPAP, followed by Dr Gwenette Greet  . Tonsillar hypertrophy     Past  Surgical History:  Procedure Laterality Date  . FINGER SURGERY  2005   ORIF of 5th digit L hand  . TONSILLECTOMY  2009     Current Outpatient Medications on File Prior to Visit  Medication Sig Dispense Refill  . ibuprofen (ADVIL,MOTRIN) 800 MG tablet Take 1 tablet (800 mg total) by mouth 3 (three) times daily with meals as needed for mild pain or moderate pain. 30 tablet 0  . MELATONIN PO Take by mouth. At bedtime    . meloxicam (MOBIC) 15 MG tablet Take 1 tablet (15 mg total) by mouth daily. 30 tablet 3   No current facility-administered medications on file prior to visit.    Physical exam:  Today's Vitals   10/20/19 1117  BP: (!) 145/87  Pulse: 97  Temp: (!) 97.4 F (36.3 C)  Weight: 250 lb (113.4 kg)  Height: 6\' 2"  (1.88 m)   Body mass index is 32.1 kg/m.   Wt Readings from Last 3 Encounters:  10/20/19 250 lb (113.4 kg)  10/13/19 255 lb 6.4 oz (115.8 kg)  06/18/19 247 lb (112 kg)     Ht Readings from Last 3 Encounters:  10/20/19 6\' 2"  (1.88 m)  10/13/19 6\' 2"  (1.88 m)  06/25/18 6\' 2"  (1.88 m)      General: The patient is awake, alert and appears not in acute distress. The patient is well groomed. Head: Normocephalic, atraumatic. Neck is supple. Mallampati 2 status post UPPP- ,  neck circumference:17 inches . Nasal airflow patent.  Retrognathia is  seen.  Dental status: crowded.  Cardiovascular:  Regular rate and cardiac rhythm by pulse,  without distended neck veins. Respiratory: Lungs are clear to auscultation.  Skin:  Without evidence of ankle edema.  Trunk: The patient's posture is erect.   Neurologic exam : The patient is awake and alert, oriented to place and time.   Memory subjective described as intact.  Attention span & concentration ability appears normal.  Speech is  fluent,  without  dysarthria, dysphonia or aphasia.  Mood and affect are appropriate.   Cranial nerves: no loss of smell or taste reported  Pupils are equal and briskly reactive to light. Funduscopic exam deferred. .  Extraocular movements in vertical and horizontal planes were intact and without nystagmus.  No Diplopia. Visual fields by finger perimetry are intact. Hearing was intact to soft voice and finger rubbing.    Facial sensation intact to fine touch.  Facial motor strength is symmetric and tongue and uvula move midline.  Neck ROM : rotation, tilt and flexion extension were normal for age and shoulder shrug was symmetrical.    Motor exam:  Symmetric bulk, tone and ROM.   Normal tone without cog wheeling, symmetric grip strength .   Sensory:  Fine touch, pinprick and vibration were normal.  Proprioception tested in the upper extremities was normal.   Coordination: Rapid alternating movements in the fingers/hands were of normal speed.  The Finger-to-nose maneuver was intact without evidence of ataxia, dysmetria or tremor.   Gait and station: Patient could rise unassisted from a seated position, walked without assistive device.  Stance is of normal width/ base and the patient turned with 3 steps.  Toe and heel walk were deferred.  Deep tendon reflexes: in the  upper and lower extremities are symmetric and intact.  Babinski response was deferred .       After spending a total time of  40 minutes face to face including  time for physical and neurologic  examination, review of laboratory studies, and referring MD notes.  personal review of imaging studies, reports and results of other testing and review of referral information / records as far as provided in visit, I have established the following assessments:  1)  Loud snoring after UPPP, the patient had a history of obstructive sleep apnea was treated with CPAP but never really felt he benefited much.  He still woke up frequently  at night which may have been an overlap with restless leg syndrome at the time.  He also felt that the mask was very intrusive.  He had been treated by Dr. Mellody Dance clamps and I suppose that he was using a full facemask.  In the meantime he has gained weight at the beginning of the pandemic he waited 219 pounds and now he is 250 pounds in clothes. This alone may be the explanation why he could have developed sleep apnea again even after having a UPPP.  His fiance has stated that he snores loudly again.  He avoids sleeping on the back and like sleeping on his side which in the past has also been a way to reduce the apneas at night but may not be alone enough anymore.  He is not really keen on starting CPAP again he would like some other way to get better sleep and he may consider the inspire device. 2) RLS untreated-the patient reports that he gets restless when he sits for a while but especially at night when he tries to go to sleep he may feel very tired and ready to sleep but his irresistible urge to move his legs keeps him from falling easily asleep and also after each bathroom break, he has frequent nocturia, he is again in the same dilemma to help initiate sleep. 3) insomnia- nocturia.  His insomnia may be mostly nocturia related but PLM's certainly do not help and that may also be other psycho positive physiological factors.   He presents with hypertension today he has a maternal family history of hypertension.  he does have a history of sciatica,  MRIs were negative in September 2020 as ordered by Dr. Anne Hahn.  He was set up for a facet joint injection at L4 S1 but decided not to go through with it.  Lower back pain right leg pain this has worsened since he is standing all day on concrete.  Back pain has to be followed by Dr. Anne Hahn as I am not in that scope of practice.  He has tried melatonin to help him sleep through but this has not been working well.    My Plan is to proceed with:  1) attended  sleep study to address possible OSA and also PLMs/ RLS.    I would like to thank Dr Anne Hahn, MD  and Glean Salvo, [address] [place],  [place] [address] for allowing me to meet with this pleasant patient.   In short, David Gamble is presenting with multifactorial insomnia, related to pain, RLS, Nocturia and snoring. ,should we find a significant level of sleep apnea, we will need to try CPAP or inspire.  I plan to follow up either personally or through our NP within 2-3 month.   CC: I will share my notes with PCP.  Electronically signed by: Melvyn Novas, MD 10/20/2019 11:30 AM  [facility] Board certified by The [employer] of Sleep Medicine and Diplomate of the [employer] of Sleep Medicine. Board certified In Neurology  through the ABPN, Fellow of the [employer] of Neurology. Medical Director of [employer].

## 2019-10-20 NOTE — Addendum Note (Signed)
Addended by: Melvyn Novas on: 10/20/2019 12:09 PM   Modules accepted: Orders

## 2019-10-20 NOTE — Patient Instructions (Signed)

## 2019-12-01 ENCOUNTER — Other Ambulatory Visit: Payer: Self-pay

## 2019-12-01 ENCOUNTER — Ambulatory Visit (INDEPENDENT_AMBULATORY_CARE_PROVIDER_SITE_OTHER): Payer: BC Managed Care – PPO | Admitting: Internal Medicine

## 2019-12-01 ENCOUNTER — Other Ambulatory Visit (HOSPITAL_COMMUNITY)
Admission: RE | Admit: 2019-12-01 | Discharge: 2019-12-01 | Disposition: A | Discharge status: Home / Self Care | Payer: BC Managed Care – PPO | Source: Ambulatory Visit | Attending: Internal Medicine | Admitting: Internal Medicine

## 2019-12-01 ENCOUNTER — Encounter: Payer: Self-pay | Admitting: Internal Medicine

## 2019-12-01 VITALS — BP 124/82 | HR 65 | Temp 96.9°F | Resp 16 | Ht 74.0 in | Wt 252.6 lb

## 2019-12-01 DIAGNOSIS — Z113 Encounter for screening for infections with a predominantly sexual mode of transmission: Secondary | ICD-10-CM | POA: Insufficient documentation

## 2019-12-01 DIAGNOSIS — E782 Mixed hyperlipidemia: Secondary | ICD-10-CM | POA: Diagnosis not present

## 2019-12-01 DIAGNOSIS — F341 Dysthymic disorder: Secondary | ICD-10-CM

## 2019-12-01 DIAGNOSIS — R030 Elevated blood-pressure reading, without diagnosis of hypertension: Secondary | ICD-10-CM | POA: Insufficient documentation

## 2019-12-01 DIAGNOSIS — M5441 Lumbago with sciatica, right side: Secondary | ICD-10-CM | POA: Diagnosis not present

## 2019-12-01 DIAGNOSIS — E66811 Obesity, class 1: Secondary | ICD-10-CM | POA: Insufficient documentation

## 2019-12-01 DIAGNOSIS — R5383 Other fatigue: Secondary | ICD-10-CM

## 2019-12-01 DIAGNOSIS — N489 Disorder of penis, unspecified: Secondary | ICD-10-CM

## 2019-12-01 DIAGNOSIS — R12 Heartburn: Secondary | ICD-10-CM

## 2019-12-01 DIAGNOSIS — Z1159 Encounter for screening for other viral diseases: Secondary | ICD-10-CM

## 2019-12-01 DIAGNOSIS — G8929 Other chronic pain: Secondary | ICD-10-CM

## 2019-12-01 DIAGNOSIS — G2581 Restless legs syndrome: Secondary | ICD-10-CM

## 2019-12-01 DIAGNOSIS — E6609 Other obesity due to excess calories: Secondary | ICD-10-CM

## 2019-12-01 DIAGNOSIS — Z114 Encounter for screening for human immunodeficiency virus [HIV]: Secondary | ICD-10-CM

## 2019-12-01 DIAGNOSIS — G4701 Insomnia due to medical condition: Secondary | ICD-10-CM

## 2019-12-01 DIAGNOSIS — G4733 Obstructive sleep apnea (adult) (pediatric): Secondary | ICD-10-CM | POA: Diagnosis not present

## 2019-12-01 DIAGNOSIS — Z6832 Body mass index (BMI) 32.0-32.9, adult: Secondary | ICD-10-CM

## 2019-12-01 NOTE — Progress Notes (Signed)
Patient ID: David Gamble, male    DOB: 13-Jun-1976, 44 y.o.   MRN: [id number]  PCP: Jamelle Haring, MD  Chief Complaint  Patient presents with  . New Patient (Initial Visit)    possible genital warts, notice bump on penis 3-4 days ago  . Establish Care    Subjective:   David Gamble is a 44 y.o. male, presents to clinic with CC of the following:  Chief Complaint  Patient presents with  . New Patient (Initial Visit)    possible genital warts, notice bump on penis 3-4 days ago  . Establish Care    HPI:  Patient is a 44 year old male who presents new to the practice. Noticed many bumps on his penis he would like to get assessed.  He first noticed this 3 to 4 days ago.  He noted it was not painful, not blistering, and was itchy.  He tried not to scratch at it.  Denied any discharge from the penis, no urinary symptoms with no burning when he pees.  No other rash concern.  He had lesions in the same area approximately 1-1/2 years ago, he states was much worse, and was seen by a provider who thought it may be herpes, and he was given a topical cream to apply.  He notes the present rash has no burning, and is not painful.  He does he gets up 3+ times at night to pee, denies any urgency or hesitancy.  He has no history of diabetes, there is no family history of diabetes.  He denies increased thirst.  His energy levels have been down some, he thinks due to his poor sleeping habits and insomnia. He has had 1 partner in the past 3 months, monogamous with the same partner, and the partner has had no symptoms of concern.  Prior to that, he states he was "single "and did have other partners, although denied any high risk activities.  He denies any STD history outside of what happened about 1-1/2 years ago.  Other issues: HTN - noted on recent neuro visits. Not check BP's at home.  Has never been on medications for this in his past. BP Readings from Last 3 Encounters:  12/01/19 124/82    10/20/19 (!) 145/87  10/13/19 (!) 163/101   Hyperlipidemia -not on meds now, on one in past, not know name No CP, palp's , SOB, no LE swelling Lab Results  Component Value Date   CHOL 232 (HH) 10/24/2007   HDL 32.8 (L) 10/24/2007   LDLDIRECT 164.7 10/24/2007   TRIG 123 10/24/2007   CHOLHDL 7.1 CALC 10/24/2007    LBP with sciatica -had an MRI in September 2020 through Dr. Anne Hahn, a facet injection at L4/S1 was arranged, the patient decided not to proceed.  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  He takes meloxicam and ibuprofen more as needed, not every day.  (And I did note these medicines chronically can increase blood pressures)  OSA/RLS/Insomnia On his last visit with neurology, it was noted he had a sleep study in 12/17 which showed only mild OSA at [facility] 6.7/h.  He had been using CPAP prior but not anxious to start again per recent neuro note. He had a tonsillectomy at age 46, and  also had a UPPP to treat apnea. Also has RLS - not treated, used requip in past and stopped after a week as not helping  Still taking melatonin and not help, 20 mg before bedtime Obesity -recent weight gain, he noted was approximately 219 at the beginning of the pandemic and now approximately 250. A repeat sleep study was felt best to pursue by neurology and was ordered by them.  Wt Readings from Last 3 Encounters:  12/01/19 252 lb 9.6 oz (114.6 kg)  10/20/19 250 lb (113.4 kg)  10/13/19 255 lb 6.4 oz (115.8 kg)   Dysthymia - 10 + years ago, prob was on meds per patient, better now. occas sadness but thinks is normal, sometimes anxious, no panic attacks.  No thoughts of hurting self or others.  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 Has daytime drowsiness 10/13/2019  . Chronic low back pain 06/18/2019  . History of ear, nose, and throat (ENT) surgery 07/11/2016  . Dysthymia 07/11/2016  . Weakness generalized 03/28/2012  . Syncope 01/10/2011  . Obstructive sleep apnea 12/16/2009  . HYPERCHOLESTEROLEMIA 11/23/2009  . HEMORRHOIDS 11/23/2009  . HEARTBURN 11/23/2009  . RESTLESS LEG SYNDROME 10/12/2009  . Overweight 10/24/2007    "Tries to stay away from medicines" - Mobic more prn, 2X week on average Xanax - prescribed by sleep doctor recent past to use before study, not take presently  Current Outpatient Medications:  .  ALPRAZolam (XANAX) 0.5 MG tablet, Take 1 tablet (0.5 mg total) by mouth at bedtime as needed for anxiety., Disp: 15 tablet, Rfl: 0 .  ibuprofen (ADVIL,MOTRIN) 800 MG tablet, Take 1 tablet (800 mg total) by mouth 3 (three) times daily with meals as needed for mild pain or moderate pain., Disp: 30 tablet, Rfl: 0 .  Melatonin 3 MG CAPS, Take 2 capsules (6 mg total) by mouth at bedtime., Disp: 30 capsule, Rfl: 0 .  meloxicam (MOBIC) 15 MG tablet, Take 1 tablet (15 mg total) by mouth daily., Disp: 30 tablet, Rfl: 3   No Known Allergies   Past Surgical History:  Procedure Laterality Date  . FINGER SURGERY  2005   ORIF of 5th digit L hand  . TONSILLECTOMY  2009     Family History  Problem Relation Age of Onset  . COPD Other   . Lung cancer Other   . Cirrhosis Other   . Hypertension Mother       Social History   Tobacco Use  . Smoking status: Never Smoker  . Smokeless tobacco: Never Used  Substance Use Topics  . Alcohol use: No    With staff assistance, above reviewed with the patient today.  ROS:  As per HPI Constitutional: Negative for fever or other Covid concerning symptoms,  Respiratory: Negative for cough. shortness of breath.   Cardiovascular: Negative for chest pain or palpitations. No increased LE  swelling Gastrointestinal: Negative for persistent abdominal pain, no concerning  bowel changes. Musculoskeletal: + back pain, Negative for joint swelling/pains, Neurological:  no increased numbness/tingling/weakness in extremities, notes vision is good with glasses No other specific complaints on sytems review (except as listed in HPI above).    No results found for this or any previous visit (from the past 72 hour(s)).   PHQ2/9: Depression screen PHQ 2/9 12/01/2019  Decreased Interest 1  Down, Depressed, Hopeless 0  PHQ - 2 Score 1  Altered sleeping 1  Tired, decreased energy 1  Change in appetite 0  Feeling bad or failure about yourself  0  Moving slowly or fidgety/restless 1  Suicidal thoughts 0  PHQ-9 Score 4  Difficult doing work/chores Not difficult at all   PHQ-2/9 Result reviewed   Fall Risk: Fall Risk  12/01/2019  Falls in the past year? 0  Number falls in past yr: 0  Injury with Fall? 0   GAD 7 : Generalized Anxiety Score 12/01/2019  Nervous, Anxious, on Edge 0  Control/stop worrying 0  Worry too much - different things 0  Trouble relaxing 1  Restless 1  Easily annoyed or irritable 0  Afraid - awful might happen 0  Total GAD 7 Score 2  Anxiety Difficulty Not difficult at all      Objective:   Vitals:   12/01/19 1304  BP: 124/82  Pulse: 65  Resp: 16  Temp: (!) 96.9 F (36.1 C)  TempSrc: Temporal  SpO2: 97%  Weight: 252 lb 9.6 oz (114.6 kg)  Height: 6\' 2"  (1.88 m)    Body mass index is 32.43 kg/m.  Physical Exam    NAD, masked, obese HEENT - [place]/AT, sclera anicteric, + glasses, PERRL, EOMI, conj - non-inj'ed, TM's and canals clear, pharynx clear with evidence of the prior UPPP procedure Neck - supple, no adenopathy, no TM, carotids 2+ and = without bruits bilat Car - RRR without m/g/r Pulm- RR and effort normal at rest, CTA without wheeze or rales Abd - soft, obese,  NT, ND, BS+,  no masses, no HSM Back - no CVA tenderness, no spinal tenderness GU -uncircumcised, with foreskin pulled back, a small cluster on the right shaft of tiny slightly raised erythematous lesions, like very small follicles, with each having a very small dark scab on top.  No scaliness, nontender, no blistering, no wartlike type process.  No discharge, testicles descended with no lumps identified, no rash elsewhere, no inguinal adenopathy Ext - no LE edema, no active joints Neuro/psychiatric - affect was not flat, appropriate with conversation  Alert and oriented  Grossly non-focal - good strength on testing extremities, sensation intact to LT in distal extremities, Romberg negative, no pronator drift, good finger-to-nose, good balance on 1 foot, gait normal with good tandem walk  Speech normal   Results for orders placed or performed in visit on 06/18/19  Basic Metabolic Panel  Result Value Ref Range   Glucose 101 (H) 65 - 99 mg/dL   BUN 24 6 - 24 mg/dL   Creatinine, Ser 06/20/19 0.76 - 1.27 mg/dL   GFR calc non Af Amer 88 >59 mL/min/1.73   GFR calc Af Amer 102 >59 mL/min/1.73   BUN/Creatinine Ratio 23 (H) 9 - 20   Sodium 143 134 - 144 mmol/L   Potassium 4.2 3.5 - 5.2 mmol/L   Chloride 108 (H) 96 - 106 mmol/L   CO2 21 20 - 29 mmol/L   Calcium 9.3 8.7 - 10.2 mg/dL  Assessment & Plan:   1. Elevated BP without diagnosis of hypertension Patient with a couple blood pressure readings slightly high on prior visits to outside providers, with his blood pressure today reasonable.  He has had no recent concerning cardiac  symptoms, or headaches or vision concerns.  He thought his recent weight gain may have contributed.  We will check some labs today, and monitor presently.  Ideally would like to get some blood pressures outside of the doctor's office, although he can't check them at home at this time.  Did strongly encourage weight loss to try to help with his blood pressure over time, and would not start any medicines at this time and await follow-up readings. - COMPLETE METABOLIC PANEL WITH GFR - CBC with Differential/Platelet  2. Mixed hyperlipidemia His LDL cholesterol was very high on a check over 10 years ago, and I would imagine it has not changed for the better since.  We will recheck labs today, even though he is not fasting, as it is hard for him to get back to try to do a fasting check in the morning.  Do anticipate adding a medicine for his cholesterol again, likely a statin product and noted that to him today, and why that would be very beneficial.  Also diet modifications and increasing physical activity as he has resumed are important to help.  Await labs with further recommendations after the results are reviewed. - COMPLETE METABOLIC PANEL WITH GFR - Lipid panel  3. OSA (obstructive sleep apnea) He is scheduled for another sleep study, and do feel that will be helpful.  Unclear if sleep apnea or the restless leg syndrome is more causative, and do feel if there is sleep apnea, treating it is important for his long-term health.  Await that result. - COMPLETE METABOLIC PANEL WITH GFR - TSH  4. Chronic right-sided low back pain with right-sided sciatica Has been better with his job changing with less standing on concrete for prolonged periods of time.  Try to minimize NSAID use recommended, and he does not take them daily presently.  Continue to monitor.  5. RLS (restless legs syndrome) As above, has tried Requip in the past, although only gave it a week's time before stopping as he did not feel was  helpful.  Not sure that is a long enough trial.  Did not recommend restarting today, and he was not too excited about trying again as it was not helpful.  await lab tests  6. Class 1 obesity due to excess calories with serious comorbidity and body mass index (BMI) of 32.0 to 32.9 in adult Discussed importance of trying to get back down to that weight closer to 200, and the many reasons why from a health standpoint.  He states he felt much better at 220, and has resumed regular exercise again.  Also will need dietary modifications to help, and strongly encouraged him to get that goal. - Lipid panel  7. Encounter for hepatitis C screening test for low risk patient He agreed to have this screening today. - Hepatitis C antibody  8. Screening for HIV (human immunodeficiency virus) He agreed to have the screening today  9. Routine screening for STI (sexually transmitted infection) Discussed with his history, STD screening, and he very much wanted to pursue this.  He felt this would give him better peace of mind if these were negative, and ordered. - HIV Antibody (routine testing w rflx) - RPR - Urine cytology ancillary only  10. Lesion  of penis Educated, and discussed my thoughts on his presentation today.  This does not seem like typical herpes, with no pain or burning, no blistering component, although I did not see this at the onset, and could be tiny scabs potentially reflect tiny fluid-filled areas that ruptured and scabbed over.  He denied him ever appearing blistering or fluid-filled. Does not look like a typical wart type entity, nor molluscum Does appear more like an irritated follicular area that perhaps scratching has caused the tiny scabs. He is uncircumcised, and could have been a potential balanitis, with some itching and scratching causing the little scabbing noted as well. Discussed the antibody test for herpes, and agreed to get a HSV IgM antibody test, although did note the  limitations of these tests, and much more important is the clinical correlation. Await the lab test presently Would just keep the area clean, not to scratch at, and closely monitor, and to expect this to resolve in the next week or 2.  If not and again worsening, other areas of concern develop, he should contact us for a follow-up. - CBC with Differential/Platelet - HSV(herpes simplex vrs) 1+2 ab-IgM  11. Fatigue, unspecified type Likely related to his sleep apnea/restless leg syndrome concerns and the insomnia from that.  Await lab test presently - COMPLETE METABOLIC PANEL WITH GFR - TSH - CBC with Differential/Platelet  12. insomnia As above, and discussed this with him.  Do not feel a medication like Xanax is best, and he agreed.  Also not anxious to add an Ambien type product or other sleep medicine entity at this time.  Do feel the sleep study will be helpful.  Discussed resuming the Requip for the restless leg syndrome to possibly help, although may consider another entity like a gabapentin type product as an option, especially noting his sciatica history.  Also discussed a product like trazodone, and antidepressant that really helps with sleep, and may be an option to try in the short-term.  He seemed more in favor of the latter, and agreed to waiting for laboratory studies return before adding any entity to help presently.  He will continue the melatonin, although notes it has not been all that helpful.  13. Dysthymia Has improved over time, and continue to monitor.  May improve further if you add the trazodone product to help with the insomnia as noted above.   14. GERD Continue with the as needed Tums presently.  Also weight loss encouraged to help. Continue to monitor  Tentatively will put a follow-up for 3 months time, and may be sooner pending results of the above studies and should follow-up sooner as needed also emphasized.   Jamelle Haring, MD 12/01/19 1:17 PM

## 2019-12-01 NOTE — Patient Instructions (Signed)

## 2019-12-02 ENCOUNTER — Encounter: Payer: Self-pay | Admitting: Internal Medicine

## 2019-12-02 NOTE — Progress Notes (Signed)
David Gamble, Not all of the lab results have returned, but I have reviewed some of them which are back. The complete metabolic panel was all normal and included the blood glucose, liver and kidney function test, and electrolytes. The thyroid screen (TSH) was normal. The complete blood count was also entirely normal. The hepatitis C antibody and HIV screening tests were both nonreactive. The RPR (screening test for syphilis) was nonreactive, and still awaiting the test results for gonorrhea and chlamydia at this time The herpes simplex virus antibody test is also still pending The lipid panel was abnormal, with a very high cholesterol (243), a very high LDL cholesterol (lousy type) at 172, and a high triglyceride level at 164.  The HDL cholesterol (healthy type) was 40, relatively low for the high cholesterol reading reflected in a high total cholesterol / HDL cholesterol ratio of 6.1 I do think starting a statin product like atorvastatin would be beneficial, and will order that to your pharmacy to pick up and start.  It is best to take the statin product in the evening.  I will include more information about cholesterol and LDL cholesterol at the end of this message. After the remaining labs have returned, and I share those results with you, please let me know if you want to try a trazodone type product at nighttime to help with the insomnia and sleep issues we discussed during your visit.  Or if you would prefer to retry the Requip again as we also discussed. Dr. Dorris Fetch  Your total cholesterol and LDL-cholesterol are above normal.  The LDL is the "lousy" or bad cholesterol. Over time and in combination with inflammation and other factors, this contributes to plaque which in turn may lead to stroke and/or heart attack down the road.  Sometimes high LDL is primarily genetic, and people might be eating all the right foods but still have high numbers.  Other times, there is room for improvement in one's diet  and eating healthier can bring this number down and potentially reduce one's risk of heart attack and/or stroke.  Your LDL level should be below 465. If you have diabetes or a possible heart problem, your LDL should be below 70.  Some strategies to focus on to help improve your LDL levels:  - Eat 20 to 30 grams of fiber every day.  - Eat Foods such as fruits and vegetables, whole grains, beans, peas, nuts, and seeds can help lower LDL. - Avoid Saturated fats - Dairy foods - such as butter, cream cheese, regular-fat milk and cheese. Meat - such as fatty cuts of beef, pork and lamb, processed meats like salami, sausages and the skin on chicken. Lard., fatty snack foods, cakes, biscuits, pies and deep fried foods) - Avoid smoking

## 2019-12-03 ENCOUNTER — Other Ambulatory Visit: Payer: Self-pay | Admitting: Internal Medicine

## 2019-12-03 ENCOUNTER — Encounter: Payer: Self-pay | Admitting: Internal Medicine

## 2019-12-03 LAB — URINE CYTOLOGY ANCILLARY ONLY
Chlamydia: NEGATIVE
Comment: NEGATIVE
Comment: NORMAL
Neisseria Gonorrhea: NEGATIVE

## 2019-12-03 MED ORDER — TRAZODONE HCL 50 MG PO TABS: 50.0000 mg | ORAL_TABLET | Freq: Every evening | ORAL | 1 refills | Status: DC | PRN

## 2019-12-03 NOTE — Progress Notes (Signed)
Patient requested a trial of the trazodone at bedtime as we discussed during his visit. This was prescribed.

## 2019-12-04 NOTE — Progress Notes (Signed)
Called patient, no answer, voicemail is full. Sent patient mychart to inform him medication has been sent to his pharmacy.

## 2019-12-05 LAB — HIV ANTIBODY (ROUTINE TESTING W REFLEX): HIV 1&2 Ab, 4th Generation: NONREACTIVE

## 2019-12-05 LAB — COMPLETE METABOLIC PANEL WITH GFR
AG Ratio: 2 (calc) (ref 1.0–2.5)
ALT: 22 U/L (ref 9–46)
AST: 22 U/L (ref 10–40)
Albumin: 4.7 g/dL (ref 3.6–5.1)
Alkaline phosphatase (APISO): 55 U/L (ref 36–130)
BUN: 17 mg/dL (ref 7–25)
CO2: 30 mmol/L (ref 20–32)
Calcium: 10 mg/dL (ref 8.6–10.3)
Chloride: 105 mmol/L (ref 98–110)
Creat: 0.88 mg/dL (ref 0.60–1.35)
GFR, Est African American: 121 mL/min/{1.73_m2} (ref >=60)
GFR, Est Non African American: 104 mL/min/{1.73_m2} (ref >=60)
Globulin: 2.4 g/dL (calc) (ref 1.9–3.7)
Glucose, Bld: 85 mg/dL (ref 65–99)
Potassium: 4.2 mmol/L (ref 3.5–5.3)
Sodium: 140 mmol/L (ref 135–146)
Total Bilirubin: 0.8 mg/dL (ref 0.2–1.2)
Total Protein: 7.1 g/dL (ref 6.1–8.1)

## 2019-12-05 LAB — CBC WITH DIFFERENTIAL/PLATELET
Absolute Monocytes: 552 cells/uL (ref 200–950)
Basophils Absolute: 80 cells/uL (ref 0–200)
Basophils Relative: 0.9 %
Eosinophils Absolute: 231 cells/uL (ref 15–500)
Eosinophils Relative: 2.6 %
HCT: 45.9 % (ref 38.5–50.0)
Hemoglobin: 15.8 g/dL (ref 13.2–17.1)
Lymphs Abs: 1905 cells/uL (ref 850–3900)
MCH: 29.4 pg (ref 27.0–33.0)
MCHC: 34.4 g/dL (ref 32.0–36.0)
MCV: 85.3 fL (ref 80.0–100.0)
MPV: 10.2 fL (ref 7.5–12.5)
Monocytes Relative: 6.2 %
Neutro Abs: 6132 cells/uL (ref 1500–7800)
Neutrophils Relative %: 68.9 %
Platelets: 184 10*3/uL (ref 140–400)
RBC: 5.38 10*6/uL (ref 4.20–5.80)
RDW: 13.1 % (ref 11.0–15.0)
Total Lymphocyte: 21.4 %
WBC: 8.9 10*3/uL (ref 3.8–10.8)

## 2019-12-05 LAB — HSV 1/2 AB (IGM), IFA W/RFLX TITER
HSV 1 IgM Screen: NEGATIVE
HSV 2 IgM Screen: NEGATIVE

## 2019-12-05 LAB — TSH: TSH: 1.01 mIU/L (ref 0.40–4.50)

## 2019-12-05 LAB — LIPID PANEL
Cholesterol: 243 mg/dL — ABNORMAL HIGH (ref <200)
HDL: 40 mg/dL (ref >=40)
LDL Cholesterol (Calc): 172 mg/dL (calc) — ABNORMAL HIGH
Non-HDL Cholesterol (Calc): 203 mg/dL (calc) — ABNORMAL HIGH (ref <130)
Total CHOL/HDL Ratio: 6.1 (calc) — ABNORMAL HIGH (ref <5.0)
Triglycerides: 164 mg/dL — ABNORMAL HIGH (ref <150)

## 2019-12-05 LAB — HEPATITIS C ANTIBODY
Hepatitis C Ab: NONREACTIVE
SIGNAL TO CUT-OFF: 0.01 (ref <1.00)

## 2019-12-05 LAB — RPR: RPR Ser Ql: NONREACTIVE

## 2019-12-16 ENCOUNTER — Ambulatory Visit: Payer: BC Managed Care – PPO | Admitting: Neurology

## 2019-12-23 ENCOUNTER — Other Ambulatory Visit: Payer: Self-pay

## 2019-12-23 ENCOUNTER — Ambulatory Visit (INDEPENDENT_AMBULATORY_CARE_PROVIDER_SITE_OTHER): Payer: BC Managed Care – PPO | Admitting: Adult Health

## 2019-12-23 ENCOUNTER — Encounter: Payer: Self-pay | Admitting: Adult Health

## 2019-12-23 VITALS — BP 136/86 | Temp 97.9°F | Wt 253.0 lb

## 2019-12-23 DIAGNOSIS — E6609 Other obesity due to excess calories: Secondary | ICD-10-CM

## 2019-12-23 DIAGNOSIS — G2581 Restless legs syndrome: Secondary | ICD-10-CM

## 2019-12-23 DIAGNOSIS — Z6832 Body mass index (BMI) 32.0-32.9, adult: Secondary | ICD-10-CM

## 2019-12-23 DIAGNOSIS — M5441 Lumbago with sciatica, right side: Secondary | ICD-10-CM

## 2019-12-23 DIAGNOSIS — E782 Mixed hyperlipidemia: Secondary | ICD-10-CM

## 2019-12-23 DIAGNOSIS — Z7689 Persons encountering health services in other specified circumstances: Secondary | ICD-10-CM | POA: Diagnosis not present

## 2019-12-23 DIAGNOSIS — F339 Major depressive disorder, recurrent, unspecified: Secondary | ICD-10-CM

## 2019-12-23 DIAGNOSIS — G8929 Other chronic pain: Secondary | ICD-10-CM

## 2019-12-23 DIAGNOSIS — G4733 Obstructive sleep apnea (adult) (pediatric): Secondary | ICD-10-CM

## 2019-12-23 DIAGNOSIS — M5442 Lumbago with sciatica, left side: Secondary | ICD-10-CM

## 2019-12-23 DIAGNOSIS — G4701 Insomnia due to medical condition: Secondary | ICD-10-CM

## 2019-12-23 MED ORDER — BUPROPION HCL ER (XL) 150 MG PO TB24: 150.0000 mg | ORAL_TABLET | Freq: Every day | ORAL | 0 refills | Status: DC

## 2019-12-23 MED ORDER — ROSUVASTATIN CALCIUM 10 MG PO TABS: 10.0000 mg | ORAL_TABLET | Freq: Every day | ORAL | 3 refills | Status: DC

## 2019-12-23 NOTE — Addendum Note (Signed)
Addended by: Nancy Fetter on: 12/23/2019 11:28 AM   Modules accepted: Orders

## 2019-12-23 NOTE — Patient Instructions (Signed)
It was great meeting you today   I am going to send in wellbutrin to help with depression - I would like to see you in 30 days for follow up - this can be virtual if you would like   I have also sent in Crestor to help lower your cholesterol levels. Please work on diet and exercise  Please call [facility] @ Phone: [phone]

## 2019-12-23 NOTE — Progress Notes (Signed)
Patient presents to clinic today to establish care. He is a pleasant 44 year old male who  has a past medical history of Heart burn, Hemorrhoids, Overweight(278.02), RLS (restless legs syndrome), Sleep apnea, and Tonsillar hypertrophy.   Acute Concerns: Establish Care   Chronic Issues: Hyperlipidemia -Recently had lipid panel done which showed elevated total cholesterol, LDL, and triglycerides.  He has never been on any cholesterol-lowering medications in the past.  He denies chest pain, shortness of breath, or lower extremity swelling Lab Results  Component Value Date   CHOL 243 (H) 12/01/2019   HDL 40 12/01/2019   LDLCALC 172 (H) 12/01/2019   LDLDIRECT 164.7 10/24/2007   TRIG 164 (H) 12/01/2019   CHOLHDL 6.1 (H) 12/01/2019    OSA/RSL/Insomnia -is seen by neurology.  He had a sleep study in December 2017 which showed mild OSA.  He has used a CPAP in the past but is no longer wearing it as he could not get a good mask fit.  He did note improvement with CPAP.  He is also had a tonsillectomy at age 89 and a UPPP to treat his apnea.  For restless legs he is used Requip in the past but stopped taking this after a week as he did not notice any improvement.  Most [facility] and does report that he continues to have restless leg but he is sleeping better at night.  Depression -was on medications in the past, probably greater than 10 years ago.  He does not remember what these medications are.  He does report often feeling sad, depressed, not wanting to do anything, and "feeling like I need to go into a dark room and cry".  Reports that the only reason he goes to work so he can pay his bills.  He denies suicidal or homicidal ideation.  Lower back pain -uses Mobic as needed.  He did have an MRI in September 2020 through Dr. Anne HahnWillis, at which time a facet injection at L4/S1 was arranged but the patient decided not to proceed with the procedure.  Since that time  he has had a change in his job and is standing less and sitting more with intermittent breaks.  He does report that his pain has improved.  Health Maintenance: Dental -- Does not do routine care Vision -- Does not do routine care  Immunizations -- UTD Colonoscopy -- never had Diet: He eats a lot of junk food.  Exercise: 2-3 days a week he works out at [employer]     Past Medical History:  Diagnosis Date  . Heart burn   . Hemorrhoids   . Overweight(278.02)   . RLS (restless legs syndrome)   . Sleep apnea    uses CPAP, followed by Dr Shelle Ironlance  . Tonsillar hypertrophy     Past Surgical History:  Procedure Laterality Date  . FINGER SURGERY  2005   ORIF of 5th digit L hand  . TONSILLECTOMY  2009    Current Outpatient Medications on File Prior to Visit  Medication Sig Dispense Refill  . ALPRAZolam (XANAX) 0.5 MG tablet Take 1 tablet (0.5 mg total) by mouth at bedtime as needed for anxiety. 15 tablet 0  . ibuprofen (ADVIL,MOTRIN) 800 MG tablet Take 1 tablet (800 mg total) by mouth 3 (three) times daily with meals as needed for mild pain or moderate pain. 30 tablet 0  . Melatonin 3 MG CAPS Take 2 capsules (6 mg total) by mouth at  bedtime. 30 capsule 0  . meloxicam (MOBIC) 15 MG tablet Take 1 tablet (15 mg total) by mouth daily. 30 tablet 3  . traZODone (DESYREL) 50 MG tablet Take 1 tablet (50 mg total) by mouth at bedtime as needed for sleep. 90 tablet 1   No current facility-administered medications on file prior to visit.    No Known Allergies  Family History  Problem Relation Age of Onset  . COPD Other   . Lung cancer Other   . Cirrhosis Other   . Hypertension Mother     Social History   Socioeconomic History  . Marital status: Divorced    Spouse name: Not on file  . Number of children: 1  . Years of education: Not on file  . Highest education level: Not on file  Occupational History  . Not on file  Tobacco Use  . Smoking status: Never Smoker  . Smokeless tobacco:  Never Used  Substance and Sexual Activity  . Alcohol use: No  . Drug use: No  . Sexual activity: Not on file  Other Topics Concern  . Not on file  Social History Narrative      Social Determinants of Health   Financial Resource Strain:   . Difficulty of Paying Living Expenses:   Food Insecurity:   . Worried About Programme researcher, broadcasting/film/video in the Last Year:   . Barista in the Last Year:   Transportation Needs:   . Freight forwarder (Medical):   Marland Kitchen Lack of Transportation (Non-Medical):   Physical Activity:   . Days of Exercise per Week:   . Minutes of Exercise per Session:   Stress:   . Feeling of Stress :   Social Connections:   . Frequency of Communication with Friends and Family:   . Frequency of Social Gatherings with Friends and Family:   . Attends Religious Services:   . Active Member of Clubs or Organizations:   . Attends Banker Meetings:   Marland Kitchen Marital Status:   Intimate Partner Violence:   . Fear of Current or Ex-Partner:   . Emotionally Abused:   Marland Kitchen Physically Abused:   . Sexually Abused:     Review of Systems  Constitutional: Negative.   Eyes: Negative.   Respiratory: Negative.   Cardiovascular: Negative.   Gastrointestinal: Negative.   Genitourinary: Negative.   Musculoskeletal: Positive for back pain.  Skin: Negative.   Neurological: Positive for tremors.  Endo/Heme/Allergies: Negative.   Psychiatric/Behavioral: Positive for depression. Negative for hallucinations, substance abuse and suicidal ideas. The patient has insomnia. The patient is not nervous/anxious.   All other systems reviewed and are negative.   There were no vitals taken for this visit.  Physical Exam Vitals and nursing note reviewed.  Constitutional:      Appearance: Normal appearance.  Cardiovascular:     Rate and Rhythm: Normal rate and regular rhythm.     Pulses: Normal pulses.     Heart sounds: Normal heart sounds.  Pulmonary:     Effort: Pulmonary effort  is normal.     Breath sounds: Normal breath sounds.  Musculoskeletal:        General: Normal range of motion.  Skin:    General: Skin is warm and dry.     Capillary Refill: Capillary refill takes less than 2 seconds.  Neurological:     General: No focal deficit present.     Mental Status: He is alert and oriented to person, place, and  time.  Psychiatric:        Mood and Affect: Mood normal.        Behavior: Behavior normal.        Thought Content: Thought content normal.        Judgment: Judgment normal.     Recent Results (from the past 2160 hour(s))  Urine cytology ancillary only     Status: None   Collection Time: 12/01/19  2:10 PM  Result Value Ref Range   Neisseria Gonorrhea Negative    Chlamydia Negative    Comment Normal Reference Ranger Chlamydia - Negative    Comment      Normal Reference Range Neisseria Gonorrhea - Negative  COMPLETE METABOLIC PANEL WITH GFR     Status: None   Collection Time: 12/01/19  2:15 PM  Result Value Ref Range   Glucose, Bld 85 65 - 99 mg/dL    Comment: .            Fasting reference interval .    BUN 17 7 - 25 mg/dL   Creat 8.41 6.60 - 6.30 mg/dL   GFR, Est Non African American 104 > OR = 60 mL/min/1.10m2   GFR, Est African American 121 > OR = 60 mL/min/1.49m2   BUN/Creatinine Ratio NOT APPLICABLE 6 - 22 (calc)   Sodium 140 135 - 146 mmol/L   Potassium 4.2 3.5 - 5.3 mmol/L   Chloride 105 98 - 110 mmol/L   CO2 30 20 - 32 mmol/L   Calcium 10.0 8.6 - 10.3 mg/dL   Total Protein 7.1 6.1 - 8.1 g/dL   Albumin 4.7 3.6 - 5.1 g/dL   Globulin 2.4 1.9 - 3.7 g/dL (calc)   AG Ratio 2.0 1.0 - 2.5 (calc)   Total Bilirubin 0.8 0.2 - 1.2 mg/dL   Alkaline phosphatase (APISO) 55 36 - 130 U/L   AST 22 10 - 40 U/L   ALT 22 9 - 46 U/L  TSH     Status: None   Collection Time: 12/01/19  2:15 PM  Result Value Ref Range   TSH 1.01 0.40 - 4.50 mIU/L  Lipid panel     Status: Abnormal   Collection Time: 12/01/19  2:15 PM  Result Value Ref Range    Cholesterol 243 (H) <200 mg/dL   HDL 40 > OR = 40 mg/dL   Triglycerides 160 (H) <150 mg/dL   LDL Cholesterol (Calc) 172 (H) mg/dL (calc)    Comment: Reference range: <100 . Desirable range <100 mg/dL for primary prevention;   <70 mg/dL for patients with CHD or diabetic patients  with > or = 2 CHD risk factors. Marland Kitchen LDL-C is now calculated using the Martin-Hopkins  calculation, which is a validated novel method providing  better accuracy than the Friedewald equation in the  estimation of LDL-C.  Horald Pollen et al. Lenox Ahr. 1093;235(57): 2061-2068  (http://education.[facility].com/faq/FAQ164)    Total CHOL/HDL Ratio 6.1 (H) <5.0 (calc)   Non-HDL Cholesterol (Calc) 203 (H) <130 mg/dL (calc)    Comment: For patients with diabetes plus 1 major ASCVD risk  factor, treating to a non-HDL-C goal of <100 mg/dL  (LDL-C of <32 mg/dL) is considered a therapeutic  option.   CBC with Differential/Platelet     Status: None   Collection Time: 12/01/19  2:15 PM  Result Value Ref Range   WBC 8.9 3.8 - 10.8 Thousand/uL   RBC 5.38 4.20 - 5.80 Million/uL   Hemoglobin 15.8 13.2 - 17.1 g/dL   HCT 20.2 54.2 -  50.0 %   MCV 85.3 80.0 - 100.0 fL   MCH 29.4 27.0 - 33.0 pg   MCHC 34.4 32.0 - 36.0 g/dL   RDW 13.1 11.0 - 15.0 %   Platelets 184 140 - 400 Thousand/uL   MPV 10.2 7.5 - 12.5 fL   Neutro Abs 6,132 1,500 - 7,800 cells/uL   Lymphs Abs 1,905 850 - 3,900 cells/uL   Absolute Monocytes 552 200 - 950 cells/uL   Eosinophils Absolute 231 15 - 500 cells/uL   Basophils Absolute 80 0 - 200 cells/uL   Neutrophils Relative % 68.9 %   Total Lymphocyte 21.4 %   Monocytes Relative 6.2 %   Eosinophils Relative 2.6 %   Basophils Relative 0.9 %  Hepatitis C antibody     Status: None   Collection Time: 12/01/19  2:15 PM  Result Value Ref Range   Hepatitis C Ab NON-REACTIVE NON-REACTI   SIGNAL TO CUT-OFF 0.01 <1.00    Comment: . HCV antibody was non-reactive. There is no laboratory  evidence of HCV  infection. . In most cases, no further action is required. However, if recent HCV exposure is suspected, a test for HCV RNA (test code [phone]) is suggested. . For additional information please refer to http://education.[facility].com/faq/FAQ22v1 (This link is being provided for informational/ educational purposes only.) .   HIV Antibody (routine testing w rflx)     Status: None   Collection Time: 12/01/19  2:15 PM  Result Value Ref Range   HIV 1&2 Ab, 4th Generation NON-REACTIVE NON-REACTI    Comment: HIV-1 antigen and HIV-1/HIV-2 antibodies were not detected. There is no laboratory evidence of HIV infection. Marland Kitchen PLEASE NOTE: This information has been disclosed to you from records whose confidentiality may be protected by state law.  If your state requires such protection, then the state law prohibits you from making any further disclosure of the information without the specific written consent of the person to whom it pertains, or as otherwise permitted by law. A general authorization for the release of medical or other information is NOT sufficient for this purpose. . For additional information please refer to http://education.[facility].com/faq/FAQ106 (This link is being provided for informational/ educational purposes only.) . Marland Kitchen The performance of this assay has not been clinically validated in patients less than 72 years old. .   RPR     Status: None   Collection Time: 12/01/19  2:15 PM  Result Value Ref Range   RPR Ser Ql NON-REACTIVE NON-REACTI  HSV 1/2 Ab (IgM), IFA w/rflx Titer     Status: None   Collection Time: 12/01/19  2:15 PM  Result Value Ref Range   HSV 1 IgM Screen Negative Negative   HSV 2 IgM Screen Negative Negative    Comment: . The IFA procedure for measuring IgM antibodies to HSV 1 and HSV 2 detects both type-common and type-specific HSV antibodies. Thus, IgM reactivity to both HSV 1 and HSV 2 may represent crossreactive HSV  antibodies rather than exposure to both HSV 1 and HSV 2. . This test was developed and its analytical performance characteristics have been determined by [employer], [place], [place]. It has not been cleared or approved by the FDA. This assay has been validated pursuant to the CLIA regulations and is used for clinical purposes. .     Assessment/Plan: 1. Encounter to establish care - Will have him follow up in 11/2020 for CPE or sooner if needed - Encouraged weight loss through diet and exercise  2. Depression, recurrent (HCC) PHQ 9 score of 17.  He would like to start on medication.  Will place on Wellbutrin 150 mg daily.  Side effects reviewed.  Follow-up in 30 days or sooner if needed.  He was advised if he develops any suicidal ideation then he needs to be seen in the emergency room - buPROPion (WELLBUTRIN XL) 150 MG 24 hr tablet; Take 1 tablet (150 mg total) by mouth daily.  Dispense: 30 tablet; Refill: 0  3. Mixed hyperlipidemia  - rosuvastatin (CRESTOR) 10 MG tablet; Take 1 tablet (10 mg total) by mouth daily.  Dispense: 90 tablet; Refill: 3  4. Class 1 obesity due to excess calories with serious comorbidity and body mass index (BMI) of 32.0 to 32.9 in adult - Encouraged heart healthy diet and exercise  5. Insomnia due to medical condition - Continue with Trazodone   6. Chronic midline low back pain with bilateral sciatica - Mobic PRN   7. OSA (obstructive sleep apnea) - May need new sleep study  - Advised to follow up with neurology as directed  8. RLS (restless legs syndrome) - Follow up with neurology as directed  Shirline Frees, NP

## 2020-01-14 ENCOUNTER — Other Ambulatory Visit: Payer: Self-pay | Admitting: Adult Health

## 2020-01-14 DIAGNOSIS — F339 Major depressive disorder, recurrent, unspecified: Secondary | ICD-10-CM

## 2020-01-15 NOTE — Telephone Encounter (Signed)
SENT TO THE PHARMACY BY E-SCRIBE FOR 30 DAYS.  PT HAS UPCOMING FOLLOW UP.

## 2020-01-21 ENCOUNTER — Other Ambulatory Visit: Payer: Self-pay

## 2020-01-22 ENCOUNTER — Ambulatory Visit (INDEPENDENT_AMBULATORY_CARE_PROVIDER_SITE_OTHER): Payer: BC Managed Care – PPO | Admitting: Adult Health

## 2020-01-22 ENCOUNTER — Encounter: Payer: Self-pay | Admitting: Adult Health

## 2020-01-22 DIAGNOSIS — F339 Major depressive disorder, recurrent, unspecified: Secondary | ICD-10-CM

## 2020-01-22 MED ORDER — BUPROPION HCL ER (XL) 300 MG PO TB24: 300.0000 mg | ORAL_TABLET | Freq: Every day | ORAL | 1 refills | Status: DC

## 2020-01-22 NOTE — Progress Notes (Signed)
Subjective:    Patient ID: David Gamble, male    DOB: 04-21-1976, 43 y.o.   MRN: [id number]  HPI 44 year old male who  has a past medical history of Heart burn, Hemorrhoids, Overweight(278.02), RLS (restless legs syndrome), Sleep apnea, and Tonsillar hypertrophy.  He presents to the office today for one month follow up regarding depression.  He had been on medications in the past but this was probably 10 years ago or longer.  When he was seen 1 month ago for his establish care visit he was reporting often feeling sad, depressed, not wanting to do anything and feeling as though "I needed to go into a dark room and cry".  He denied suicidal ideation.  At this time he was started on Wellbutrin 150 mg extended release.  Today he reports that he does feel like he is improving, he is no longer having crying spells and does not feel as sad or depressed.  He does feel as though the medication is working and has not experienced any side effects.   Review of Systems See HPI   Past Medical History:  Diagnosis Date  . Heart burn   . Hemorrhoids   . Overweight(278.02)   . RLS (restless legs syndrome)   . Sleep apnea    uses CPAP, followed by Dr Shelle Iron  . Tonsillar hypertrophy     Social History   Socioeconomic History  . Marital status: Divorced    Spouse name: Not on file  . Number of children: 1  . Years of education: Not on file  . Highest education level: Not on file  Occupational History  . Not on file  Tobacco Use  . Smoking status: Never Smoker  . Smokeless tobacco: Never Used  Substance and Sexual Activity  . Alcohol use: No  . Drug use: No  . Sexual activity: Not on file  Other Topics Concern  . Not on file  Social History Narrative   He works for [employer] -    One child ( 5)    [place] native    Social Determinants of Corporate investment banker Strain:   . Difficulty of Paying Living Expenses:   Food Insecurity:   . Worried About Programme researcher, broadcasting/film/video in  the Last Year:   . Barista in the Last Year:   Transportation Needs:   . Freight forwarder (Medical):   Marland Kitchen Lack of Transportation (Non-Medical):   Physical Activity:   . Days of Exercise per Week:   . Minutes of Exercise per Session:   Stress:   . Feeling of Stress :   Social Connections:   . Frequency of Communication with Friends and Family:   . Frequency of Social Gatherings with Friends and Family:   . Attends Religious Services:   . Active Member of Clubs or Organizations:   . Attends Banker Meetings:   Marland Kitchen Marital Status:   Intimate Partner Violence:   . Fear of Current or Ex-Partner:   . Emotionally Abused:   Marland Kitchen Physically Abused:   . Sexually Abused:     Past Surgical History:  Procedure Laterality Date  . FINGER SURGERY  2005   ORIF of 5th digit L hand  . TONSILLECTOMY  2009    Family History  Problem Relation Age of Onset  . COPD Other   . Lung cancer Other   . Cirrhosis Other   . Hypertension Mother   . Depression  Mother   . Cirrhosis Father   . Alcohol abuse Father   . Cancer Paternal Grandmother   . Alzheimer's disease Paternal Grandfather     No Known Allergies  Current Outpatient Medications on File Prior to Visit  Medication Sig Dispense Refill  . ibuprofen (ADVIL,MOTRIN) 800 MG tablet Take 1 tablet (800 mg total) by mouth 3 (three) times daily with meals as needed for mild pain or moderate pain. 30 tablet 0  . meloxicam (MOBIC) 15 MG tablet Take 1 tablet (15 mg total) by mouth daily. 30 tablet 3  . rosuvastatin (CRESTOR) 10 MG tablet Take 1 tablet (10 mg total) by mouth daily. 90 tablet 3  . traZODone (DESYREL) 50 MG tablet Take 1 tablet (50 mg total) by mouth at bedtime as needed for sleep. 90 tablet 1   No current facility-administered medications on file prior to visit.    BP 100/62   Temp 98 F (36.7 C)   Wt 252 lb (114.3 kg)   BMI 32.35 kg/m       Objective:   Physical Exam Vitals and nursing note  reviewed.  Constitutional:      Appearance: Normal appearance.  Skin:    General: Skin is warm and dry.     Capillary Refill: Capillary refill takes less than 2 seconds.  Neurological:     General: No focal deficit present.     Mental Status: He is alert and oriented to person, place, and time.  Psychiatric:        Mood and Affect: Mood normal.        Behavior: Behavior normal.        Thought Content: Thought content normal.        Judgment: Judgment normal.       Assessment & Plan:  1. Depression, recurrent ([place]) -We will increase Wellbutrin to 300 mg extended release to see if he can get any more benefit.  He was advised to follow-up with me via MyChart in the next 90 days to see how he is doing or sooner in the office if needed - buPROPion (WELLBUTRIN XL) 300 MG 24 hr tablet; Take 1 tablet (300 mg total) by mouth daily.  Dispense: 90 tablet; Refill: 1   Dorothyann Peng, NP

## 2020-01-23 ENCOUNTER — Telehealth: Payer: Self-pay | Admitting: Adult Health

## 2020-01-23 ENCOUNTER — Other Ambulatory Visit: Payer: Self-pay | Admitting: Adult Health

## 2020-01-23 DIAGNOSIS — G4733 Obstructive sleep apnea (adult) (pediatric): Secondary | ICD-10-CM

## 2020-01-23 NOTE — Telephone Encounter (Signed)
Referral placed.

## 2020-01-23 NOTE — Telephone Encounter (Signed)
Pt notified that referral has been placed.  Nothing further needed. 

## 2020-01-23 NOTE — Telephone Encounter (Signed)
Pt would like to see a physician at [facility], he no longer wishes to see his current neurologist and would like to move within the [facility] practice    Pt can be reached at [phone]

## 2020-01-23 NOTE — Progress Notes (Signed)
Referral to [place] [facility]

## 2020-02-03 ENCOUNTER — Telehealth: Payer: Self-pay | Admitting: *Deleted

## 2020-02-03 NOTE — Telephone Encounter (Signed)
I called pt 01-26-20 to ask why he was switching to another sleep lab.  He called back (bad connection) end of day, phonce call dropped.

## 2020-02-04 ENCOUNTER — Other Ambulatory Visit: Payer: Self-pay

## 2020-02-04 NOTE — Telephone Encounter (Signed)
LMVM for pt that calling about upcoming appt for pt with SS/NP in 04-13-20 then also to ask about sleep study (seeing other practice).

## 2020-02-05 ENCOUNTER — Ambulatory Visit (INDEPENDENT_AMBULATORY_CARE_PROVIDER_SITE_OTHER): Payer: BC Managed Care – PPO | Admitting: Adult Health

## 2020-02-05 ENCOUNTER — Encounter: Payer: Self-pay | Admitting: Adult Health

## 2020-02-05 ENCOUNTER — Ambulatory Visit: Payer: BC Managed Care – PPO | Admitting: Adult Health

## 2020-02-05 VITALS — BP 110/70 | Temp 97.2°F | Wt 247.0 lb

## 2020-02-05 DIAGNOSIS — K644 Residual hemorrhoidal skin tags: Secondary | ICD-10-CM | POA: Diagnosis not present

## 2020-02-05 NOTE — Patient Instructions (Addendum)
It was great seeing you today   Please pick up hemorrhoid wipes such as preporation H use these 2-3 times a day. Also get some over the counter cortisone 10 and apply this twice a day to help shrink the hemorrhoids.   You can continue Sitz baths   Stay well hydrated and use fiber supplements such a metamucil/benifber daily        Hemorrhoids Hemorrhoids are swollen veins that may develop:  In the butt (rectum). These are called internal hemorrhoids.  Around the opening of the butt (anus). These are called external hemorrhoids. Hemorrhoids can cause pain, itching, or bleeding. Most of the time, they do not cause serious problems. They usually get better with diet changes, lifestyle changes, and other home treatments. What are the causes? This condition may be caused by:  Having trouble pooping (constipation).  Pushing hard (straining) to poop.  Watery poop (diarrhea).  Pregnancy.  Being very overweight (obese).  Sitting for long periods of time.  Heavy lifting or other activity that causes you to strain.  Anal sex.  Riding a bike for a long period of time. What are the signs or symptoms? Symptoms of this condition include:  Pain.  Itching or soreness in the butt.  Bleeding from the butt.  Leaking poop.  Swelling in the area.  One or more lumps around the opening of your butt. How is this diagnosed? A doctor can often diagnose this condition by looking at the affected area. The doctor may also:  Do an exam that involves feeling the area with a gloved hand (digital rectal exam).  Examine the area inside your butt using a small tube (anoscope).  Order blood tests. This may be done if you have lost a lot of blood.  Have you get a test that involves looking inside the colon using a flexible tube with a camera on the end (sigmoidoscopy or colonoscopy). How is this treated? This condition can usually be treated at home. Your doctor may tell you to change what  you eat, make lifestyle changes, or try home treatments. If these do not help, procedures can be done to remove the hemorrhoids or make them smaller. These may involve:  Placing rubber bands at the base of the hemorrhoids to cut off their blood supply.  Injecting medicine into the hemorrhoids to shrink them.  Shining a type of light energy onto the hemorrhoids to cause them to fall off.  Doing surgery to remove the hemorrhoids or cut off their blood supply. Follow these instructions at home: Eating and drinking   Eat foods that have a lot of fiber in them. These include whole grains, beans, nuts, fruits, and vegetables.  Ask your doctor about taking products that have added fiber (fibersupplements).  Reduce the amount of fat in your diet. You can do this by: ? Eating low-fat dairy products. ? Eating less red meat. ? Avoiding processed foods.  Drink enough fluid to keep your pee (urine) pale yellow. Managing pain and swelling   Take a warm-water bath (sitz bath) for 20 minutes to ease pain. Do this 3-4 times a day. You may do this in a bathtub or using a portable sitz bath that fits over the toilet.  If told, put ice on the painful area. It may be helpful to use ice between your warm baths. ? Put ice in a plastic bag. ? Place a towel between your skin and the bag. ? Leave the ice on for 20 minutes, 2-3 times  a day. General instructions  Take over-the-counter and prescription medicines only as told by your doctor. ? Medicated creams and medicines may be used as told.  Exercise often. Ask your doctor how much and what kind of exercise is best for you.  Go to the bathroom when you have the urge to poop. Do not wait.  Avoid pushing too hard when you poop.  Keep your butt dry and clean. Use wet toilet paper or moist towelettes after pooping.  Do not sit on the toilet for a long time.  Keep all follow-up visits as told by your doctor. This is important. Contact a doctor if  you:  Have pain and swelling that do not get better with treatment or medicine.  Have trouble pooping.  Cannot poop.  Have pain or swelling outside the area of the hemorrhoids. Get help right away if you have:  Bleeding that will not stop. Summary  Hemorrhoids are swollen veins in the butt or around the opening of the butt.  They can cause pain, itching, or bleeding.  Eat foods that have a lot of fiber in them. These include whole grains, beans, nuts, fruits, and vegetables.  Take a warm-water bath (sitz bath) for 20 minutes to ease pain. Do this 3-4 times a day. This information is not intended to replace advice given to you by your health care provider. Make sure you discuss any questions you have with your health care provider. Document Revised: 09/12/2018 Document Reviewed: 01/24/2018 Elsevier Patient Education  [place].

## 2020-02-05 NOTE — Progress Notes (Signed)
Subjective:    Patient ID: David Gamble, male    DOB: 02/03/1976, 44 y.o.   MRN: [id number]  HPI  44 year old male who  has a past medical history of Heart burn, Hemorrhoids, Overweight(278.02), RLS (restless legs syndrome), Sleep apnea, and Tonsillar hypertrophy.  He presents to the office today for an acute issue of rectal itching x 1 month. He does report right red blood on the toilet paper when straining to use the restroom.   He denies pain or burning.   He has not used any over the counter products    Review of Systems  See HPI   Past Medical History:  Diagnosis Date  . Heart burn   . Hemorrhoids   . Overweight(278.02)   . RLS (restless legs syndrome)   . Sleep apnea    uses CPAP, followed by Dr Gwenette Greet  . Tonsillar hypertrophy     Social History   Socioeconomic History  . Marital status: Divorced    Spouse name: Not on file  . Number of children: 1  . Years of education: Not on file  . Highest education level: Not on file  Occupational History  . Not on file  Tobacco Use  . Smoking status: Never Smoker  . Smokeless tobacco: Never Used  Substance and Sexual Activity  . Alcohol use: No  . Drug use: No  . Sexual activity: Not on file  Other Topics Concern  . Not on file  Social History Narrative   He works for [employer] -    One child ( 51)    [place] native    Social Determinants of Radio broadcast assistant Strain:   . Difficulty of Paying Living Expenses:   Food Insecurity:   . Worried About Charity fundraiser in the Last Year:   . Arboriculturist in the Last Year:   Transportation Needs:   . Film/video editor (Medical):   Marland Kitchen Lack of Transportation (Non-Medical):   Physical Activity:   . Days of Exercise per Week:   . Minutes of Exercise per Session:   Stress:   . Feeling of Stress :   Social Connections:   . Frequency of Communication with Friends and Family:   . Frequency of Social Gatherings with Friends and Family:   .  Attends Religious Services:   . Active Member of Clubs or Organizations:   . Attends Archivist Meetings:   Marland Kitchen Marital Status:   Intimate Partner Violence:   . Fear of Current or Ex-Partner:   . Emotionally Abused:   Marland Kitchen Physically Abused:   . Sexually Abused:     Past Surgical History:  Procedure Laterality Date  . FINGER SURGERY  2005   ORIF of 5th digit L hand  . TONSILLECTOMY  2009    Family History  Problem Relation Age of Onset  . COPD Other   . Lung cancer Other   . Cirrhosis Other   . Hypertension Mother   . Depression Mother   . Cirrhosis Father   . Alcohol abuse Father   . Cancer Paternal Grandmother   . Alzheimer's disease Paternal Grandfather     No Known Allergies  Current Outpatient Medications on File Prior to Visit  Medication Sig Dispense Refill  . buPROPion (WELLBUTRIN XL) 300 MG 24 hr tablet Take 1 tablet (300 mg total) by mouth daily. 90 tablet 1  . ibuprofen (ADVIL,MOTRIN) 800 MG tablet Take 1 tablet (800  mg total) by mouth 3 (three) times daily with meals as needed for mild pain or moderate pain. 30 tablet 0  . meloxicam (MOBIC) 15 MG tablet Take 1 tablet (15 mg total) by mouth daily. 30 tablet 3  . rosuvastatin (CRESTOR) 10 MG tablet Take 1 tablet (10 mg total) by mouth daily. 90 tablet 3  . traZODone (DESYREL) 50 MG tablet Take 1 tablet (50 mg total) by mouth at bedtime as needed for sleep. 90 tablet 1   No current facility-administered medications on file prior to visit.    BP 110/70   Temp (!) 97.2 F (36.2 C)   Wt 247 lb (112 kg)   BMI 31.71 kg/m      Objective:   Physical Exam Vitals and nursing note reviewed.  Constitutional:      Appearance: Normal appearance.  Cardiovascular:     Pulses: Normal pulses.  Genitourinary:    Rectum: External hemorrhoid present.  Skin:    General: Skin is warm and dry.     Capillary Refill: Capillary refill takes less than 2 seconds.  Neurological:     General: No focal deficit present.      Mental Status: He is alert and oriented to person, place, and time.  Psychiatric:        Mood and Affect: Mood normal.        Behavior: Behavior normal.        Thought Content: Thought content normal.        Judgment: Judgment normal.        Assessment & Plan:  1. External hemorrhoids - Preperation H pads TID - OTC cortisone cream BID  - No straining on toilet - Stay hydrated and increase fiber in diet  - Follow up as needed  Shirline Frees, NP

## 2020-03-02 ENCOUNTER — Ambulatory Visit: Payer: BC Managed Care – PPO | Admitting: Internal Medicine

## 2020-03-04 ENCOUNTER — Other Ambulatory Visit: Payer: Self-pay

## 2020-03-05 ENCOUNTER — Ambulatory Visit (INDEPENDENT_AMBULATORY_CARE_PROVIDER_SITE_OTHER): Payer: BC Managed Care – PPO | Admitting: Adult Health

## 2020-03-05 ENCOUNTER — Encounter: Payer: Self-pay | Admitting: Adult Health

## 2020-03-05 VITALS — BP 116/70 | Temp 97.9°F | Wt 242.0 lb

## 2020-03-05 DIAGNOSIS — M533 Sacrococcygeal disorders, not elsewhere classified: Secondary | ICD-10-CM

## 2020-03-05 DIAGNOSIS — B356 Tinea cruris: Secondary | ICD-10-CM

## 2020-03-05 MED ORDER — FLUCONAZOLE 150 MG PO TABS: ORAL_TABLET | ORAL | 0 refills | Status: DC

## 2020-03-05 NOTE — Progress Notes (Signed)
Subjective:    Patient ID: David Gamble, male    DOB: 19-Jul-1976, 44 y.o.   MRN: [id number]  HPI 44 year old male who  has a past medical history of Heart burn, Hemorrhoids, Overweight(278.02), RLS (restless legs syndrome), Sleep apnea, and Tonsillar hypertrophy.  He presents to the office today for three weeks of tailbone pain. The only time he feels any discomfort is when he sits. He denies pain with bowel movements, standing, or walking. No current radiating pain.   He denies any recent trauma or aggravating injuries.   Feels like a bruise but is not able to recreate pain with palpation    Review of Systems See HPI   Past Medical History:  Diagnosis Date  . Heart burn   . Hemorrhoids   . Overweight(278.02)   . RLS (restless legs syndrome)   . Sleep apnea    uses CPAP, followed by Dr Gwenette Greet  . Tonsillar hypertrophy     Social History   Socioeconomic History  . Marital status: Divorced    Spouse name: Not on file  . Number of children: 1  . Years of education: Not on file  . Highest education level: Not on file  Occupational History  . Not on file  Tobacco Use  . Smoking status: Never Smoker  . Smokeless tobacco: Never Used  Vaping Use  . Vaping Use: Never used  Substance and Sexual Activity  . Alcohol use: No  . Drug use: No  . Sexual activity: Not on file  Other Topics Concern  . Not on file  Social History Narrative   He works for [employer] -    One child ( 58)    [place] native    Social Determinants of Radio broadcast assistant Strain:   . Difficulty of Paying Living Expenses:   Food Insecurity:   . Worried About Charity fundraiser in the Last Year:   . Arboriculturist in the Last Year:   Transportation Needs:   . Film/video editor (Medical):   Marland Kitchen Lack of Transportation (Non-Medical):   Physical Activity:   . Days of Exercise per Week:   . Minutes of Exercise per Session:   Stress:   . Feeling of Stress :   Social  Connections:   . Frequency of Communication with Friends and Family:   . Frequency of Social Gatherings with Friends and Family:   . Attends Religious Services:   . Active Member of Clubs or Organizations:   . Attends Archivist Meetings:   Marland Kitchen Marital Status:   Intimate Partner Violence:   . Fear of Current or Ex-Partner:   . Emotionally Abused:   Marland Kitchen Physically Abused:   . Sexually Abused:     Past Surgical History:  Procedure Laterality Date  . FINGER SURGERY  2005   ORIF of 5th digit L hand  . TONSILLECTOMY  2009    Family History  Problem Relation Age of Onset  . COPD Other   . Lung cancer Other   . Cirrhosis Other   . Hypertension Mother   . Depression Mother   . Cirrhosis Father   . Alcohol abuse Father   . Cancer Paternal Grandmother   . Alzheimer's disease Paternal Grandfather     No Known Allergies  Current Outpatient Medications on File Prior to Visit  Medication Sig Dispense Refill  . buPROPion (WELLBUTRIN XL) 300 MG 24 hr tablet Take 1 tablet (300 mg  total) by mouth daily. 90 tablet 1  . ibuprofen (ADVIL,MOTRIN) 800 MG tablet Take 1 tablet (800 mg total) by mouth 3 (three) times daily with meals as needed for mild pain or moderate pain. 30 tablet 0  . meloxicam (MOBIC) 15 MG tablet Take 1 tablet (15 mg total) by mouth daily. 30 tablet 3  . rosuvastatin (CRESTOR) 10 MG tablet Take 1 tablet (10 mg total) by mouth daily. 90 tablet 3  . traZODone (DESYREL) 50 MG tablet Take 1 tablet (50 mg total) by mouth at bedtime as needed for sleep. 90 tablet 1   No current facility-administered medications on file prior to visit.    BP 116/70   Temp 97.9 F (36.6 C)   Wt 242 lb (109.8 kg)   BMI 31.07 kg/m       Objective:   Physical Exam Vitals and nursing note reviewed.  Constitutional:      Appearance: Normal appearance.  Musculoskeletal:        General: Normal range of motion.  Skin:    General: Skin is warm and dry.     Capillary Refill:  Capillary refill takes less than 2 seconds.     Findings: Rash present.     Comments: Fungal rash noted in gluteal cleft area perineal area  Neurological:     General: No focal deficit present.     Mental Status: He is alert and oriented to person, place, and time.  Psychiatric:        Mood and Affect: Mood normal.        Behavior: Behavior normal.        Thought Content: Thought content normal.        Judgment: Judgment normal.       Assessment & Plan:  1. Coccydynia - unsure if this is the cause or if pain from fungal infection.  We will treat for fungal infection and he will follow-up if pain does not resolve once the fungal infection is gone  2. Tinea cruris  - fluconazole (DIFLUCAN) 150 MG tablet; Take one tablet weekly x 3 weeks  Dispense: 3 tablet; Refill: 0  Shirline Frees, NP

## 2020-04-13 ENCOUNTER — Ambulatory Visit: Payer: BC Managed Care – PPO | Admitting: Neurology

## 2020-05-20 ENCOUNTER — Encounter: Payer: Self-pay | Admitting: Adult Health

## 2020-05-20 ENCOUNTER — Ambulatory Visit (INDEPENDENT_AMBULATORY_CARE_PROVIDER_SITE_OTHER): Payer: BC Managed Care – PPO | Admitting: Adult Health

## 2020-05-20 ENCOUNTER — Other Ambulatory Visit: Payer: Self-pay

## 2020-05-20 DIAGNOSIS — B356 Tinea cruris: Secondary | ICD-10-CM | POA: Diagnosis not present

## 2020-05-20 MED ORDER — FLUCONAZOLE 150 MG PO TABS: ORAL_TABLET | ORAL | 0 refills | Status: DC

## 2020-05-20 MED ORDER — KETOCONAZOLE 2 % EX SHAM: 1.0000 "application " | MEDICATED_SHAMPOO | CUTANEOUS | 0 refills | Status: DC

## 2020-05-20 NOTE — Progress Notes (Signed)
Subjective:    Patient ID: David Gamble, male    DOB: March 09, 1976, 44 y.o.   MRN: [id number]  HPI 44 year old male who  has a past medical history of Heart burn, Hemorrhoids, Overweight(278.02), RLS (restless legs syndrome), Sleep apnea, and Tonsillar hypertrophy.  He presents to the office today for acute issue of recurrent tinea infection in his groin.  He was last seen for this issue in June 2021 at which time Diflucan was prescribed.  This worked well for him.  Over the last 2 weeks the fungal infection has returned.  He has been applying cortisone 10 which seems to help slightly but he continues to have intense itching and redness on both sides of his groin and perineal area  Review of Systems See HPI   Past Medical History:  Diagnosis Date  . Heart burn   . Hemorrhoids   . Overweight(278.02)   . RLS (restless legs syndrome)   . Sleep apnea    uses CPAP, followed by Dr Shelle Iron  . Tonsillar hypertrophy     Social History   Socioeconomic History  . Marital status: Divorced    Spouse name: Not on file  . Number of children: 1  . Years of education: Not on file  . Highest education level: Not on file  Occupational History  . Not on file  Tobacco Use  . Smoking status: Never Smoker  . Smokeless tobacco: Never Used  Vaping Use  . Vaping Use: Never used  Substance and Sexual Activity  . Alcohol use: No  . Drug use: No  . Sexual activity: Not on file  Other Topics Concern  . Not on file  Social History Narrative   He works for [employer] -    One child ( 57)    [place] native    Social Determinants of Corporate investment banker Strain:   . Difficulty of Paying Living Expenses: Not on file  Food Insecurity:   . Worried About Programme researcher, broadcasting/film/video in the Last Year: Not on file  . Ran Out of Food in the Last Year: Not on file  Transportation Needs:   . Lack of Transportation (Medical): Not on file  . Lack of Transportation (Non-Medical): Not on file    Physical Activity:   . Days of Exercise per Week: Not on file  . Minutes of Exercise per Session: Not on file  Stress:   . Feeling of Stress : Not on file  Social Connections:   . Frequency of Communication with Friends and Family: Not on file  . Frequency of Social Gatherings with Friends and Family: Not on file  . Attends Religious Services: Not on file  . Active Member of Clubs or Organizations: Not on file  . Attends Banker Meetings: Not on file  . Marital Status: Not on file  Intimate Partner Violence:   . Fear of Current or Ex-Partner: Not on file  . Emotionally Abused: Not on file  . Physically Abused: Not on file  . Sexually Abused: Not on file    Past Surgical History:  Procedure Laterality Date  . FINGER SURGERY  2005   ORIF of 5th digit L hand  . TONSILLECTOMY  2009    Family History  Problem Relation Age of Onset  . COPD Other   . Lung cancer Other   . Cirrhosis Other   . Hypertension Mother   . Depression Mother   . Cirrhosis Father   .  Alcohol abuse Father   . Cancer Paternal Grandmother   . Alzheimer's disease Paternal Grandfather     No Known Allergies  Current Outpatient Medications on File Prior to Visit  Medication Sig Dispense Refill  . buPROPion (WELLBUTRIN XL) 300 MG 24 hr tablet Take 1 tablet (300 mg total) by mouth daily. 90 tablet 1  . ibuprofen (ADVIL,MOTRIN) 800 MG tablet Take 1 tablet (800 mg total) by mouth 3 (three) times daily with meals as needed for mild pain or moderate pain. 30 tablet 0  . meloxicam (MOBIC) 15 MG tablet Take 1 tablet (15 mg total) by mouth daily. 30 tablet 3  . rosuvastatin (CRESTOR) 10 MG tablet Take 1 tablet (10 mg total) by mouth daily. 90 tablet 3  . traZODone (DESYREL) 50 MG tablet Take 1 tablet (50 mg total) by mouth at bedtime as needed for sleep. 90 tablet 1   No current facility-administered medications on file prior to visit.    BP 108/60   Pulse (!) 59   Temp 97.8 F (36.6 C) (Oral)    Ht 6\' 2"  (1.88 m)   Wt 221 lb (100.2 kg)   SpO2 98%   BMI 28.37 kg/m       Objective:   Physical Exam Vitals and nursing note reviewed.  Constitutional:      Appearance: Normal appearance.  Skin:    General: Skin is warm and dry.     Findings: Rash present. Rash is scaling.       Neurological:     General: No focal deficit present.     Mental Status: He is alert and oriented to person, place, and time.  Psychiatric:        Mood and Affect: Mood normal.        Behavior: Behavior normal.        Thought Content: Thought content normal.        Judgment: Judgment normal.       Assessment & Plan:  1. Tinea cruris -We will prescribe Diflucan again weekly x3 weeks.  We will also send in ketoconazole shampoo that he can use as body wash 2-3 times a week. - fluconazole (DIFLUCAN) 150 MG tablet; Take one tablet weekly x 3 weeks  Dispense: 3 tablet; Refill: 0 - ketoconazole (NIZORAL) 2 % shampoo; Apply 1 application topically 2 (two) times a week.  Dispense: 120 mL; Refill: 0 -Follow-up as needed  , NP

## 2020-05-21 ENCOUNTER — Other Ambulatory Visit: Payer: Self-pay | Admitting: Neurology

## 2020-05-27 ENCOUNTER — Other Ambulatory Visit: Payer: Self-pay

## 2020-05-27 MED ORDER — TRAZODONE HCL 50 MG PO TABS: 50.0000 mg | ORAL_TABLET | Freq: Every evening | ORAL | 1 refills | Status: DC | PRN

## 2020-06-17 ENCOUNTER — Other Ambulatory Visit: Payer: Self-pay | Admitting: Adult Health

## 2020-06-17 DIAGNOSIS — B356 Tinea cruris: Secondary | ICD-10-CM

## 2020-06-24 ENCOUNTER — Other Ambulatory Visit: Payer: Self-pay | Admitting: Neurology

## 2020-06-27 ENCOUNTER — Encounter: Payer: Self-pay | Admitting: Internal Medicine

## 2020-08-05 ENCOUNTER — Encounter: Payer: Self-pay | Admitting: Adult Health

## 2020-08-05 ENCOUNTER — Ambulatory Visit (INDEPENDENT_AMBULATORY_CARE_PROVIDER_SITE_OTHER): Payer: BC Managed Care – PPO | Admitting: Adult Health

## 2020-08-05 ENCOUNTER — Other Ambulatory Visit: Payer: Self-pay

## 2020-08-05 VITALS — BP 102/60 | HR 54 | Temp 98.0°F | Ht 74.0 in | Wt 210.0 lb

## 2020-08-05 DIAGNOSIS — L03113 Cellulitis of right upper limb: Secondary | ICD-10-CM

## 2020-08-05 MED ORDER — DOXYCYCLINE HYCLATE 100 MG PO CAPS: 100.0000 mg | ORAL_CAPSULE | Freq: Two times a day (BID) | ORAL | 0 refills | Status: DC

## 2020-08-05 NOTE — Progress Notes (Signed)
Subjective:    Patient ID: David Gamble, male    DOB: [birth year], 44 y.o.   MRN: [id number]  HPI 44 year old male who  has a past medical history of Heart burn, Hemorrhoids, Overweight(278.02), RLS (restless legs syndrome), Sleep apnea, and Tonsillar hypertrophy.  He presents to the office today for an acute issue of sudden onset of right elbow pain and swelling x 2 days. He denies any injury or trauma   Review of Systems  See HPI  Past Medical History:  Diagnosis Date   Heart burn    Hemorrhoids    Overweight(278.02)    RLS (restless legs syndrome)    Sleep apnea    uses CPAP, followed by Dr Hennie Duos hypertrophy     Social History   Socioeconomic History   Marital status: Divorced    Spouse name: Not on file   Number of children: 1   Years of education: Not on file   Highest education level: Not on file  Occupational History   Not on file  Tobacco Use   Smoking status: Never Smoker   Smokeless tobacco: Never Used  Vaping Use   Vaping Use: Never used  Substance and Sexual Activity   Alcohol use: No   Drug use: No   Sexual activity: Not on file  Other Topics Concern   Not on file  Social History Narrative   He works for [employer] -    One child ( 30)    [place] native    Social Determinants of Corporate investment banker Strain:    Difficulty of Paying Living Expenses: Not on file  Food Insecurity:    Worried About Programme researcher, broadcasting/film/video in the Last Year: Not on file   [employer] of Food in the Last Year: Not on file  Transportation Needs:    Freight forwarder (Medical): Not on file   Lack of Transportation (Non-Medical): Not on file  Physical Activity:    Days of Exercise per Week: Not on file   Minutes of Exercise per Session: Not on file  Stress:    Feeling of Stress : Not on file  Social Connections:    Frequency of Communication with Friends and Family: Not on file   Frequency of Social Gatherings  with Friends and Family: Not on file   Attends Religious Services: Not on file   Active Member of Clubs or Organizations: Not on file   Attends Banker Meetings: Not on file   Marital Status: Not on file  Intimate Partner Violence:    Fear of Current or Ex-Partner: Not on file   Emotionally Abused: Not on file   Physically Abused: Not on file   Sexually Abused: Not on file    Past Surgical History:  Procedure Laterality Date   FINGER SURGERY  2005   ORIF of 5th digit L hand   TONSILLECTOMY  2009    Family History  Problem Relation Age of Onset   COPD Other    Lung cancer Other    Cirrhosis Other    Hypertension Mother    Depression Mother    Cirrhosis Father    Alcohol abuse Father    Cancer Paternal Grandmother    Alzheimer's disease Paternal Grandfather     No Known Allergies  Current Outpatient Medications on File Prior to Visit  Medication Sig Dispense Refill   buPROPion (WELLBUTRIN XL) 300 MG 24 hr tablet Take 1 tablet (  300 mg total) by mouth daily. 90 tablet 1   fluconazole (DIFLUCAN) 150 MG tablet Take one tablet weekly x 3 weeks 3 tablet 0   ibuprofen (ADVIL,MOTRIN) 800 MG tablet Take 1 tablet (800 mg total) by mouth 3 (three) times daily with meals as needed for mild pain or moderate pain. 30 tablet 0   ketoconazole (NIZORAL) 2 % shampoo APPLY 1 APPLICATION TOPICALLY 2 (TWO) TIMES A WEEK. 120 mL 0   meloxicam (MOBIC) 15 MG tablet TAKE 1 TABLET BY MOUTH EVERY DAY 30 tablet 2   rosuvastatin (CRESTOR) 10 MG tablet Take 1 tablet (10 mg total) by mouth daily. 90 tablet 3   traZODone (DESYREL) 50 MG tablet Take 1 tablet (50 mg total) by mouth at bedtime as needed for sleep. 90 tablet 1   No current facility-administered medications on file prior to visit.    BP 102/60 (BP Location: Left Arm, Patient Position: Sitting, Cuff Size: Normal)    Pulse (!) 54    Temp 98 F (36.7 C) (Oral)    Ht 6\' 2"  (1.88 m)    Wt 210 lb (95.3 kg)     BMI 26.96 kg/m       Objective:   Physical Exam Vitals and nursing note reviewed.  Cardiovascular:     Rate and Rhythm: Normal rate and regular rhythm.     Pulses: Normal pulses.     Heart sounds: Normal heart sounds.  Skin:    General: Skin is warm and dry.     Capillary Refill: Capillary refill takes less than 2 seconds.     Findings: Erythema present.     Comments: Localized erythema, redness, and soft tissue swelling noted to the right elbow.  No fluid accumulation noted  Neurological:     General: No focal deficit present.     Mental Status: He is oriented to person, place, and time.  Psychiatric:        Mood and Affect: Mood normal.        Behavior: Behavior normal.        Thought Content: Thought content normal.        Judgment: Judgment normal.       Assessment & Plan:  1. Cellulitis of right elbow -We will treat with doxycycline 100 mg twice daily.  Advise follow-up if he develops any streaking up his arm, fevers, chills, or fluid accumulation - doxycycline (VIBRAMYCIN) 100 MG capsule; Take 1 capsule (100 mg total) by mouth 2 (two) times daily.  Dispense: 20 capsule; Refill: 0   , NP

## 2020-12-02 ENCOUNTER — Ambulatory Visit: Payer: BC Managed Care – PPO | Admitting: Adult Health

## 2020-12-02 DIAGNOSIS — Z0289 Encounter for other administrative examinations: Secondary | ICD-10-CM

## 2021-01-07 ENCOUNTER — Ambulatory Visit (INDEPENDENT_AMBULATORY_CARE_PROVIDER_SITE_OTHER): Payer: BC Managed Care – PPO

## 2021-01-07 ENCOUNTER — Encounter: Payer: Self-pay | Admitting: Podiatry

## 2021-01-07 ENCOUNTER — Ambulatory Visit: Payer: BC Managed Care – PPO | Admitting: Podiatry

## 2021-01-07 ENCOUNTER — Other Ambulatory Visit: Payer: Self-pay

## 2021-01-07 DIAGNOSIS — M21619 Bunion of unspecified foot: Secondary | ICD-10-CM | POA: Diagnosis not present

## 2021-01-07 DIAGNOSIS — M7672 Peroneal tendinitis, left leg: Secondary | ICD-10-CM | POA: Diagnosis not present

## 2021-01-07 DIAGNOSIS — M79671 Pain in right foot: Secondary | ICD-10-CM

## 2021-01-07 DIAGNOSIS — T81509A Unspecified complication of foreign body accidentally left in body following unspecified procedure, initial encounter: Secondary | ICD-10-CM

## 2021-01-07 DIAGNOSIS — M79672 Pain in left foot: Secondary | ICD-10-CM | POA: Diagnosis not present

## 2021-01-11 NOTE — Progress Notes (Signed)
Subjective:   Patient ID: David Gamble, male   DOB: 45 y.o.   MRN: [id number]   HPI Patient presents stating that he is got a painful spot on the bottom of his left foot and he thinks he had it 6 months ago and they worked on it and then it did well and then reoccurred.  Also points to the outside of the foot and states that can be bothersome.  Patient does not smoke and does like to be active   Review of Systems  All other systems reviewed and are negative.       Objective:  Physical Exam Vitals and nursing note reviewed.  Constitutional:      Appearance: He is well-developed.  Pulmonary:     Effort: Pulmonary effort is normal.  Musculoskeletal:        General: Normal range of motion.  Skin:    General: Skin is warm.  Neurological:     Mental Status: He is alert.     Neurovascular status was found to be intact muscle strength was found to be adequate inflammation of the outside of the left foot mild to moderate in intensity and plantarly I noted there is a discoloration area that irritative and localized around the second metatarsal proximal to the metatarsal head.  Measures about 4 x 4 millimeter with discoloration     Assessment:  Possibility for foreign body formation versus keratotic tissue or other inflammatory condition or possible wart     Plan:  H&P reviewed condition and also x-rays and I also discussed peroneal tendinitis of which I recommended ice and topical medicines.  Plantarly left sterile prep done and a sterile debridement of the area is able to get out a small amount of foreign material that look localized I did not note any metallic product I flushed the wound and applied sterile dressing hopefully results and if not we may have to make a deeper cut into the tissue  X-rays indicate that there is no forms of metallic material noted around this area at this time

## 2021-01-31 ENCOUNTER — Ambulatory Visit: Payer: BC Managed Care – PPO | Admitting: Podiatry

## 2021-02-17 ENCOUNTER — Other Ambulatory Visit: Payer: Self-pay

## 2021-02-17 ENCOUNTER — Encounter: Payer: Self-pay | Admitting: Adult Health

## 2021-02-17 ENCOUNTER — Ambulatory Visit (INDEPENDENT_AMBULATORY_CARE_PROVIDER_SITE_OTHER): Payer: BC Managed Care – PPO | Admitting: Adult Health

## 2021-02-17 VITALS — BP 140/70 | HR 61 | Temp 98.1°F | Ht 73.75 in | Wt 212.0 lb

## 2021-02-17 DIAGNOSIS — M545 Low back pain, unspecified: Secondary | ICD-10-CM | POA: Diagnosis not present

## 2021-02-17 DIAGNOSIS — Z0000 Encounter for general adult medical examination without abnormal findings: Secondary | ICD-10-CM

## 2021-02-17 DIAGNOSIS — Z1211 Encounter for screening for malignant neoplasm of colon: Secondary | ICD-10-CM

## 2021-02-17 DIAGNOSIS — E782 Mixed hyperlipidemia: Secondary | ICD-10-CM

## 2021-02-17 DIAGNOSIS — G4701 Insomnia due to medical condition: Secondary | ICD-10-CM | POA: Diagnosis not present

## 2021-02-17 DIAGNOSIS — G8929 Other chronic pain: Secondary | ICD-10-CM

## 2021-02-17 DIAGNOSIS — R351 Nocturia: Secondary | ICD-10-CM

## 2021-02-17 DIAGNOSIS — G4733 Obstructive sleep apnea (adult) (pediatric): Secondary | ICD-10-CM

## 2021-02-17 MED ORDER — TRAZODONE HCL 50 MG PO TABS: 50.0000 mg | ORAL_TABLET | Freq: Every evening | ORAL | 1 refills | Status: DC | PRN

## 2021-02-17 NOTE — Patient Instructions (Signed)
It was great seeing you today   I have sent in your Trazodone for insomnia   Please schedule a lab appointment at the front desk - I will follow up with you once I get the labs back   Someone will call you to schedule your colonoscopy

## 2021-02-17 NOTE — Progress Notes (Signed)
Subjective:    Patient ID: David Gamble, male    DOB: 07-24-1976, 45 y.o.   MRN: [id number]  HPI Patient presents for yearly preventative medicine examination. 45 year old male who  has a past medical history of Heart burn, Hemorrhoids, Overweight(278.02), RLS (restless legs syndrome), Sleep apnea, and Tonsillar hypertrophy.  Hyperlipidemia - no longer taking Crestor 10 mg  Lab Results  Component Value Date   CHOL 243 (H) 12/01/2019   HDL 40 12/01/2019   LDLCALC 172 (H) 12/01/2019   LDLDIRECT 164.7 10/24/2007   TRIG 164 (H) 12/01/2019   CHOLHDL 6.1 (H) 12/01/2019   Depression - was prescribed Wellbutrin 300 mg XR daily. He is no longer taking this medication and does not feel as though he needs it any longer.   Lower back pain - Uses Mobic PRN.   Low Testosterone - is getting Testosterone injections through and integrative medicine injection [facility])   OSA-seen by [facility] neurology in the past.  He did have a sleep study done in December 2017 which showed mild OSA.  He is not wearing his CPAP due to poor mask fit.    Insomnia - takes Trazodone 50 mg PRN   Nocturia - chronic issue. Gets up multiple times a night to urinate. Does not report decreased stream but does have some incomplete bladder emptying and dribbling. Does drink a lot of water but has been cutting back at night to see if this helps and has not noticed much improvement. Denies UTI like symptoms   All immunizations and health maintenance protocols were reviewed with the patient and needed orders were placed.  Appropriate screening laboratory values were ordered for the patient including screening of hyperlipidemia, renal function and hepatic function. If indicated by BPH, a PSA was ordered.  Medication reconciliation,  past medical history, social history, problem list and allergies were reviewed in detail with the patient  Goals were established with regard to weight loss, exercise, and  diet in compliance  with medications. Has been doing weight training and aerobic exercise Wt Readings from Last 3 Encounters:  02/17/21 212 lb (96.2 kg)  08/05/20 210 lb (95.3 kg)  05/20/20 221 lb (100.2 kg)     End of life planning was discussed.   Review of Systems  Constitutional: Negative.   HENT: Negative.   Eyes: Negative.   Respiratory: Negative.   Cardiovascular: Negative.   Gastrointestinal: Negative.   Endocrine: Negative.   Genitourinary: Positive for frequency. Negative for difficulty urinating.  Musculoskeletal: Negative.   Skin: Negative.   Allergic/Immunologic: Negative.   Neurological: Negative.   Hematological: Negative.   Psychiatric/Behavioral: Positive for sleep disturbance.  All other systems reviewed and are negative.  Past Medical History:  Diagnosis Date  . Heart burn   . Hemorrhoids   . Overweight(278.02)   . RLS (restless legs syndrome)   . Sleep apnea    uses CPAP, followed by Dr Shelle Iron  . Tonsillar hypertrophy     Social History   Socioeconomic History  . Marital status: Divorced    Spouse name: Not on file  . Number of children: 1  . Years of education: Not on file  . Highest education level: Not on file  Occupational History  . Not on file  Tobacco Use  . Smoking status: Never Smoker  . Smokeless tobacco: Never Used  Vaping Use  . Vaping Use: Never used  Substance and Sexual Activity  . Alcohol use: No  . Drug use: No  .  Sexual activity: Not on file  Other Topics Concern  . Not on file  Social History Narrative   He works for [employer] -    One child ( 34)    [place] native    Social Determinants of Corporate investment banker Strain: Not on [employer] Insecurity: Not on file  Transportation Needs: Not on file  Physical Activity: Not on file  Stress: Not on file  Social Connections: Not on file  Intimate Partner Violence: Not on file    Past Surgical History:  Procedure Laterality Date  . FINGER SURGERY  2005   ORIF of 5th  digit L hand  . TONSILLECTOMY  2009    Family History  Problem Relation Age of Onset  . COPD Other   . Lung cancer Other   . Cirrhosis Other   . Hypertension Mother   . Depression Mother   . Cirrhosis Father   . Alcohol abuse Father   . Cancer Paternal Grandmother   . Alzheimer's disease Paternal Grandfather     No Known Allergies  Current Outpatient Medications on File Prior to Visit  Medication Sig Dispense Refill  . ibuprofen (ADVIL,MOTRIN) 800 MG tablet Take 1 tablet (800 mg total) by mouth 3 (three) times daily with meals as needed for mild pain or moderate pain. 30 tablet 0  . meloxicam (MOBIC) 15 MG tablet TAKE 1 TABLET BY MOUTH EVERY DAY 30 tablet 2  . rosuvastatin (CRESTOR) 10 MG tablet Take 1 tablet (10 mg total) by mouth daily. (Patient not taking: Reported on 02/17/2021) 90 tablet 3   No current facility-administered medications on file prior to visit.    BP 140/70   Pulse 61   Temp 98.1 F (36.7 C) (Oral)   Ht 6' 1.75" (1.873 m)   Wt 212 lb (96.2 kg)   SpO2 96%   BMI 27.40 kg/m       Objective:   Physical Exam Vitals and nursing note reviewed.  Constitutional:      General: He is not in acute distress.    Appearance: Normal appearance. He is well-developed and normal weight.  HENT:     Head: Normocephalic and atraumatic.     Right Ear: Tympanic membrane, ear canal and external ear normal. There is no impacted cerumen.     Left Ear: Tympanic membrane, ear canal and external ear normal. There is no impacted cerumen.     Nose: Nose normal. No congestion or rhinorrhea.     Mouth/Throat:     Mouth: Mucous membranes are moist.     Pharynx: Oropharynx is clear. No oropharyngeal exudate or posterior oropharyngeal erythema.  Eyes:     General:        Right eye: No discharge.        Left eye: No discharge.     Extraocular Movements: Extraocular movements intact.     Conjunctiva/sclera: Conjunctivae normal.     Pupils: Pupils are equal, round, and reactive  to light.  Neck:     Vascular: No carotid bruit.     Trachea: No tracheal deviation.  Cardiovascular:     Rate and Rhythm: Normal rate and regular rhythm.     Pulses: Normal pulses.     Heart sounds: Normal heart sounds. No murmur heard. No friction rub. No gallop.   Pulmonary:     Effort: Pulmonary effort is normal. No respiratory distress.     Breath sounds: Normal breath sounds. No stridor. No wheezing, rhonchi or  rales.  Chest:     Chest wall: No tenderness.  Abdominal:     General: Abdomen is flat. Bowel sounds are normal. There is no distension.     Palpations: Abdomen is soft. There is no mass.     Tenderness: There is no abdominal tenderness. There is no right CVA tenderness, left CVA tenderness, guarding or rebound.     Hernia: No hernia is present.  Musculoskeletal:        General: No swelling, tenderness, deformity or signs of injury. Normal range of motion.     Right lower leg: No edema.     Left lower leg: No edema.  Lymphadenopathy:     Cervical: No cervical adenopathy.  Skin:    General: Skin is warm and dry.     Capillary Refill: Capillary refill takes less than 2 seconds.     Coloration: Skin is not jaundiced or pale.     Findings: No bruising, erythema, lesion or rash.  Neurological:     General: No focal deficit present.     Mental Status: He is alert and oriented to person, place, and time.     Cranial Nerves: No cranial nerve deficit.     Sensory: No sensory deficit.     Motor: No weakness.     Coordination: Coordination normal.     Gait: Gait normal.     Deep Tendon Reflexes: Reflexes normal.  Psychiatric:        Mood and Affect: Mood normal.        Behavior: Behavior normal.        Thought Content: Thought content normal.        Judgment: Judgment normal.        Assessment & Plan:  1. Routine general medical examination at a health care facility - Continue to exercise and eat healthy  - Follow up in one year or sooner if needed - Ambulatory  referral to Gastroenterology - CBC with Differential/Platelet; Future - Comprehensive metabolic panel; Future - Hemoglobin A1c; Future - Lipid panel; Future - TSH; Future  2. Mixed hyperlipidemia - Consider adding back Crestor  - Ambulatory referral to Gastroenterology - CBC with Differential/Platelet; Future - Comprehensive metabolic panel; Future - Hemoglobin A1c; Future - Lipid panel; Future - TSH; Future  3. Insomnia due to medical condition  - traZODone (DESYREL) 50 MG tablet; Take 1 tablet (50 mg total) by mouth at bedtime as needed for sleep.  Dispense: 90 tablet; Refill: 1  4. Chronic bilateral low back pain without sciatica - Continue with Mobic   5. OSA (obstructive sleep apnea) - Can follow up with Neurology PRN   6. Colon cancer screening  - Ambulatory referral to Gastroenterology  7. Nocturia - Consider flomax - PSA; Future - Urinalysis; Future  Shirline Frees, NP

## 2021-02-21 ENCOUNTER — Other Ambulatory Visit (INDEPENDENT_AMBULATORY_CARE_PROVIDER_SITE_OTHER): Payer: BC Managed Care – PPO

## 2021-02-21 ENCOUNTER — Other Ambulatory Visit: Payer: Self-pay

## 2021-02-21 DIAGNOSIS — Z0000 Encounter for general adult medical examination without abnormal findings: Secondary | ICD-10-CM | POA: Diagnosis not present

## 2021-02-21 DIAGNOSIS — R351 Nocturia: Secondary | ICD-10-CM | POA: Diagnosis not present

## 2021-02-21 DIAGNOSIS — E782 Mixed hyperlipidemia: Secondary | ICD-10-CM | POA: Diagnosis not present

## 2021-02-21 LAB — COMPREHENSIVE METABOLIC PANEL
ALT: 28 U/L (ref 0–53)
AST: 21 U/L (ref 0–37)
Albumin: 4.4 g/dL (ref 3.5–5.2)
Alkaline Phosphatase: 40 U/L (ref 39–117)
BUN: 22 mg/dL (ref 6–23)
CO2: 28 mEq/L (ref 19–32)
Calcium: 9.1 mg/dL (ref 8.4–10.5)
Chloride: 103 mEq/L (ref 96–112)
Creatinine, Ser: 0.96 mg/dL (ref 0.40–1.50)
GFR: 95.15 mL/min (ref >60.00)
Glucose, Bld: 93 mg/dL (ref 70–99)
Potassium: 4.1 mEq/L (ref 3.5–5.1)
Sodium: 139 mEq/L (ref 135–145)
Total Bilirubin: 0.5 mg/dL (ref 0.2–1.2)
Total Protein: 6.6 g/dL (ref 6.0–8.3)

## 2021-02-21 LAB — URINALYSIS
Bilirubin Urine: NEGATIVE
Hgb urine dipstick: NEGATIVE
Ketones, ur: NEGATIVE
Leukocytes,Ua: NEGATIVE
Nitrite: NEGATIVE
Specific Gravity, Urine: 1.01 (ref 1.000–1.030)
Total Protein, Urine: NEGATIVE
Urine Glucose: NEGATIVE
Urobilinogen, UA: 0.2 (ref 0.0–1.0)
pH: 6 (ref 5.0–8.0)

## 2021-02-21 LAB — CBC WITH DIFFERENTIAL/PLATELET
Basophils Absolute: 0.1 10*3/uL (ref 0.0–0.1)
Basophils Relative: 0.7 % (ref 0.0–3.0)
Eosinophils Absolute: 0.3 10*3/uL (ref 0.0–0.7)
Eosinophils Relative: 3.9 % (ref 0.0–5.0)
HCT: 45.7 % (ref 39.0–52.0)
Hemoglobin: 15.9 g/dL (ref 13.0–17.0)
Lymphocytes Relative: 23.5 % (ref 12.0–46.0)
Lymphs Abs: 1.9 10*3/uL (ref 0.7–4.0)
MCHC: 34.9 g/dL (ref 30.0–36.0)
MCV: 85.3 fl (ref 78.0–100.0)
Monocytes Absolute: 0.5 10*3/uL (ref 0.1–1.0)
Monocytes Relative: 6.3 % (ref 3.0–12.0)
Neutro Abs: 5.3 10*3/uL (ref 1.4–7.7)
Neutrophils Relative %: 65.6 % (ref 43.0–77.0)
Platelets: 155 10*3/uL (ref 150.0–400.0)
RBC: 5.36 Mil/uL (ref 4.22–5.81)
RDW: 12.8 % (ref 11.5–15.5)
WBC: 8.1 10*3/uL (ref 4.0–10.5)

## 2021-02-21 LAB — LIPID PANEL
Cholesterol: 215 mg/dL — ABNORMAL HIGH (ref 0–200)
HDL: 37.3 mg/dL — ABNORMAL LOW (ref >39.00)
LDL Cholesterol: 162 mg/dL — ABNORMAL HIGH (ref 0–99)
NonHDL: 177.56
Total CHOL/HDL Ratio: 6
Triglycerides: 80 mg/dL (ref 0.0–149.0)
VLDL: 16 mg/dL (ref 0.0–40.0)

## 2021-02-21 LAB — HEMOGLOBIN A1C: Hgb A1c MFr Bld: 5.2 % (ref 4.6–6.5)

## 2021-02-21 LAB — TSH: TSH: 1.88 u[IU]/mL (ref 0.35–4.50)

## 2021-02-21 LAB — PSA: PSA: 1.29 ng/mL (ref 0.10–4.00)

## 2021-02-22 MED ORDER — ROSUVASTATIN CALCIUM 10 MG PO TABS: 10.0000 mg | ORAL_TABLET | Freq: Every day | ORAL | 3 refills | Status: DC

## 2021-03-09 ENCOUNTER — Encounter: Payer: Self-pay | Admitting: Adult Health

## 2021-03-16 ENCOUNTER — Telehealth (INDEPENDENT_AMBULATORY_CARE_PROVIDER_SITE_OTHER): Payer: BC Managed Care – PPO | Admitting: Adult Health

## 2021-03-16 ENCOUNTER — Encounter: Payer: Self-pay | Admitting: Adult Health

## 2021-03-16 VITALS — Ht 73.75 in | Wt 212.0 lb

## 2021-03-16 DIAGNOSIS — H9201 Otalgia, right ear: Secondary | ICD-10-CM

## 2021-03-16 DIAGNOSIS — J22 Unspecified acute lower respiratory infection: Secondary | ICD-10-CM | POA: Diagnosis not present

## 2021-03-16 MED ORDER — PREDNISONE 10 MG PO TABS: 10.0000 mg | ORAL_TABLET | Freq: Every day | ORAL | 0 refills | Status: DC

## 2021-03-16 MED ORDER — AMOXICILLIN-POT CLAVULANATE 875-125 MG PO TABS: 1.0000 | ORAL_TABLET | Freq: Two times a day (BID) | ORAL | 0 refills | Status: DC

## 2021-03-16 MED ORDER — FLUTICASONE PROPIONATE 50 MCG/ACT NA SUSP: 2.0000 | Freq: Every day | NASAL | 6 refills | Status: DC

## 2021-03-16 NOTE — Progress Notes (Signed)
Virtual Visit via Video Note  I connected with David Gamble on 03/16/21 at  2:00 PM EDT by a video enabled telemedicine application and verified that I am speaking with the correct person using two identifiers.  Location patient: home Location provider:work or home office Persons participating in the virtual visit: patient, provider  I discussed the limitations of evaluation and management by telemedicine and the availability of in person appointments. The patient expressed understanding and agreed to proceed.   HPI: 45 year old male who is being evaluated today for an acute issue.  He tested positive for COVID-19 roughly 2.5 weeks ago on 02/28/2021.  Most of his symptoms are resolved but he continues to have chest congestion of breath productive cough of yellow mucus as well as right ear pain.  Reports that the nurse at work looked in his right ear and noted that his tympanic membrane was red and he appeared to have some fluid behind the TM.  Denies sinus pain or pressure, drainage from his right ear, wheezing, shortness of breath, or fevers.  He has been using Mucinex at home but this has not been helping with his symptoms.   ROS: See pertinent positives and negatives per HPI.  Past Medical History:  Diagnosis Date   Heart burn    Hemorrhoids    Overweight(278.02)    RLS (restless legs syndrome)    Sleep apnea    uses CPAP, followed by Dr Hennie Duos hypertrophy     Past Surgical History:  Procedure Laterality Date   FINGER SURGERY  2005   ORIF of 5th digit L hand   TONSILLECTOMY  2009    Family History  Problem Relation Age of Onset   COPD Other    Lung cancer Other    Cirrhosis Other    Hypertension Mother    Depression Mother    Cirrhosis Father    Alcohol abuse Father    Cancer Paternal Grandmother    Alzheimer's disease Paternal Grandfather        Current Outpatient Medications:    ibuprofen (ADVIL,MOTRIN) 800 MG tablet, Take 1 tablet (800 mg total)  by mouth 3 (three) times daily with meals as needed for mild pain or moderate pain., Disp: 30 tablet, Rfl: 0   meloxicam (MOBIC) 15 MG tablet, TAKE 1 TABLET BY MOUTH EVERY DAY, Disp: 30 tablet, Rfl: 2   meloxicam (MOBIC) 15 MG tablet, Take by mouth., Disp: , Rfl:    rosuvastatin (CRESTOR) 10 MG tablet, Take 1 tablet (10 mg total) by mouth daily., Disp: 90 tablet, Rfl: 3   tadalafil (CIALIS) 20 MG tablet, Take by mouth., Disp: , Rfl:    testosterone cypionate (DEPOTESTOSTERONE CYPIONATE) 200 MG/ML injection, compounded with gso includes blue10kit 0.6 cc, Disp: , Rfl:    traZODone (DESYREL) 50 MG tablet, Take 1 tablet (50 mg total) by mouth at bedtime as needed for sleep., Disp: 90 tablet, Rfl: 1  EXAM:  VITALS per patient if applicable:  GENERAL: alert, oriented, appears well and in no acute distress  HEENT: atraumatic, conjunttiva clear, no obvious abnormalities on inspection of external nose and ears  NECK: normal movements of the head and neck  LUNGS: on inspection no signs of respiratory distress, breathing rate appears normal, no obvious gross SOB, gasping or wheezing  CV: no obvious cyanosis  MS: moves all visible extremities without noticeable abnormality  PSYCH/NEURO: pleasant and cooperative, no obvious depression or anxiety, speech and thought processing grossly intact  ASSESSMENT AND PLAN:  Discussed  the following assessment and plan:  1. Right ear pain -Eustachian tube dysfunction versus otitis media.  Will cover for can [place] bacterial respiratory infection as well as ear infection with Augmentin.  Also prescribed Flonase. - amoxicillin-clavulanate (AUGMENTIN) 875-125 MG tablet; Take 1 tablet by mouth 2 (two) times daily.  Dispense: 20 tablet; Refill: 0 - fluticasone (FLONASE) 50 MCG/ACT nasal spray; Place 2 sprays into both nostrils daily.  Dispense: 16 g; Refill: 6  2. Lower respiratory tract infection  - amoxicillin-clavulanate (AUGMENTIN) 875-125 MG tablet; Take 1  tablet by mouth 2 (two) times daily.  Dispense: 20 tablet; Refill: 0 - predniSONE (DELTASONE) 10 MG tablet; Take 1 tablet (10 mg total) by mouth daily with breakfast.  Dispense: 7 tablet; Refill: 0 - Follow up in 2-3 days if symptoms not improving      I discussed the assessment and treatment plan with the patient. The patient was provided an opportunity to ask questions and all were answered. The patient agreed with the plan and demonstrated an understanding of the instructions.   The patient was advised to call back or seek an in-person evaluation if the symptoms worsen or if the condition fails to improve as anticipated.   Shirline Frees, NP

## 2021-04-05 ENCOUNTER — Encounter: Payer: Self-pay | Admitting: Adult Health

## 2021-04-05 ENCOUNTER — Telehealth (INDEPENDENT_AMBULATORY_CARE_PROVIDER_SITE_OTHER): Payer: BC Managed Care – PPO | Admitting: Adult Health

## 2021-04-05 VITALS — Ht 73.75 in | Wt 212.0 lb

## 2021-04-05 DIAGNOSIS — J988 Other specified respiratory disorders: Secondary | ICD-10-CM

## 2021-04-05 MED ORDER — HYDROCODONE BIT-HOMATROP MBR 5-1.5 MG/5ML PO SOLN: 5.0000 mL | Freq: Every evening | ORAL | 0 refills | Status: DC | PRN

## 2021-04-05 MED ORDER — SPACER/AERO-HOLDING CHAMBERS DEVI: 0 refills | Status: DC

## 2021-04-05 MED ORDER — ALBUTEROL SULFATE HFA 108 (90 BASE) MCG/ACT IN AERS
2.0000 | INHALATION_SPRAY | Freq: Four times a day (QID) | RESPIRATORY_TRACT | 0 refills | Status: DC | PRN
Start: 2021-04-05 — End: 2021-04-27

## 2021-04-05 NOTE — Progress Notes (Signed)
Virtual Visit via Telephone Note  I connected with Cherre Huger on 04/05/21 at  5:00 PM EDT by telephone and verified that I am speaking with the correct person using two identifiers.   I discussed the limitations, risks, security and privacy concerns of performing an evaluation and management service by telephone and the availability of in person appointments. I also discussed with the patient that there may be a patient responsible charge related to this service. The patient expressed understanding and agreed to proceed.  Location patient: home Location provider: work or home office Participants present for the call: patient, provider Patient did not have a visit in the prior 7 days to address this/these issue(s).   History of Present Illness: 45 year old male who is following up today regarding respiratory infection.  He was seen on 03/16/2021 after he tested positive for COVID-19 roughly 2 weeks prior.  Most of his symptoms resolved but he continued to have chest congestion with a productive cough of yellow mucus.  He was also having ear pain and reported that the nurse at his work looked at his right here and noticed that his tympanic membrane was red and appeared to have some fluid behind the TM.  He was treated with Augmentin x10 days, Flonase, and prednisone.  He will finish his last Augmentin tonight, has finished his prednisone and is using Flonase.  Symptoms have improved but he continues to have a somewhat productive cough with yellow sputum, and sinus congestion denies shortness of breath, wheezing, fever, or chills.   Observations/Objective: Patient sounds cheerful and well on the phone. I do not appreciate any SOB. Dry cough noted Speech and thought processing are grossly intact. Patient reported vitals:  Assessment and Plan: 1. Respiratory infection - Long covid symptoms vs allergies vs post viral cough.  - Spacer/Aero-Holding Rudean Curt; Use with inhaler  Dispense: 1  each; Refill: 0 - albuterol (VENTOLIN HFA) 108 (90 Base) MCG/ACT inhaler; Inhale 2 puffs into the lungs every 6 (six) hours as needed for wheezing or shortness of breath.  Dispense: 18 g; Refill: 0 - HYDROcodone bit-homatropine (HYCODAN) 5-1.5 MG/5ML syrup; Take 5 mLs by mouth at bedtime as needed for cough.  Dispense: 120 mL; Refill: 0   Follow Up Instructions:  I did not refer this patient for an OV in the next 24 hours for this/these issue(s).  I discussed the assessment and treatment plan with the patient. The patient was provided an opportunity to ask questions and all were answered. The patient agreed with the plan and demonstrated an understanding of the instructions.   The patient was advised to call back or seek an in-person evaluation if the symptoms worsen or if the condition fails to improve as anticipated.  I provided 15 minutes of non-face-to-face time during this encounter.   Shirline Frees, NP

## 2021-04-15 ENCOUNTER — Ambulatory Visit: Payer: BC Managed Care – PPO | Admitting: Adult Health

## 2021-04-18 ENCOUNTER — Telehealth: Payer: Self-pay | Admitting: Adult Health

## 2021-04-18 NOTE — Telephone Encounter (Signed)
Patient scheduled an office visit for cough and congestion. Appointment was changed to virtual due to patient having symptoms. Called patient to ask if he would like to be seen sooner virtually with [facility].  Patient would like to know if he could come into office. Patient is willing to be tested for covid again if he needs to in order to be brought into office.   Patient also asked if Kandee Keen could send in the other cough syrup they discussed during his previous visit. He says that it is a cough syrup that Kandee Keen said would make him sleepy. He says that if this could be called in without the visit, that would be fine as well.  Please advise.

## 2021-04-19 ENCOUNTER — Other Ambulatory Visit: Payer: Self-pay | Admitting: Adult Health

## 2021-04-19 DIAGNOSIS — J988 Other specified respiratory disorders: Secondary | ICD-10-CM

## 2021-04-19 MED ORDER — HYDROCODONE BIT-HOMATROP MBR 5-1.5 MG/5ML PO SOLN: 5.0000 mL | Freq: Every evening | ORAL | 0 refills | Status: DC | PRN

## 2021-04-19 NOTE — Telephone Encounter (Signed)
Please advise 

## 2021-04-20 NOTE — Telephone Encounter (Signed)
Patient notified of update  and verbalized understanding. 

## 2021-04-20 NOTE — Telephone Encounter (Signed)
Tried to call pt no answer. Okay for pt to come into the office and cough medication was sent in.

## 2021-04-21 ENCOUNTER — Telehealth: Payer: Self-pay

## 2021-04-21 NOTE — Telephone Encounter (Signed)
Patient called stating he hasn't received HYDROcodone bit-homatropine (HYCODAN) 5-1.5 MG/5ML syrup I informed pt Rx was sent and  to call the pharmacy

## 2021-04-26 NOTE — Telephone Encounter (Signed)
Pt Rx is ready for pick up. Spoke to pt and he stated that the cough has been getting better. Pt stated that he is still cough up yellow mucus coming out of nose and mouth. Pt stated that he is not sure if he needs Zpak  or abx.

## 2021-04-27 ENCOUNTER — Telehealth (INDEPENDENT_AMBULATORY_CARE_PROVIDER_SITE_OTHER): Payer: BC Managed Care – PPO | Admitting: Adult Health

## 2021-04-27 ENCOUNTER — Ambulatory Visit (INDEPENDENT_AMBULATORY_CARE_PROVIDER_SITE_OTHER)
Admission: RE | Admit: 2021-04-27 | Discharge: 2021-04-27 | Disposition: A | Discharge status: Home / Self Care | Payer: BC Managed Care – PPO | Source: Ambulatory Visit | Attending: Adult Health | Admitting: Adult Health

## 2021-04-27 ENCOUNTER — Encounter: Payer: Self-pay | Admitting: Adult Health

## 2021-04-27 ENCOUNTER — Other Ambulatory Visit: Payer: Self-pay | Admitting: Adult Health

## 2021-04-27 ENCOUNTER — Other Ambulatory Visit: Payer: Self-pay

## 2021-04-27 VITALS — Ht 73.75 in | Wt 213.0 lb

## 2021-04-27 DIAGNOSIS — J988 Other specified respiratory disorders: Secondary | ICD-10-CM

## 2021-04-27 DIAGNOSIS — R053 Chronic cough: Secondary | ICD-10-CM

## 2021-04-27 NOTE — Progress Notes (Signed)
Virtual Visit via Telephone Note  I connected with David Gamble on 04/27/21 at  9:00 AM EDT by telephone and verified that I am speaking with the correct person using two identifiers.   I discussed the limitations, risks, security and privacy concerns of performing an evaluation and management service by telephone and the availability of in person appointments. I also discussed with the patient that there may be a patient responsible charge related to this service. The patient expressed understanding and agreed to proceed.  Location patient: home Location provider: work or home office Participants present for the call: patient, provider Patient did not have a visit in the prior 7 days to address this/these issue(s).   History of Present Illness: 45 year old male who  has a past medical history of Heart burn, Hemorrhoids, Overweight(278.02), RLS (restless legs syndrome), Sleep apnea, and Tonsillar hypertrophy.  He is being evaluated today for ongoing semiproductive cough with yellow mucus and yellow nasal drainage.  His symptoms have been present for roughly 2 months after being diagnosed with COVID-19.  He was last valuated on 04/05/2021 at which time he was finishing up a 10-day course of Augmentin and had also been on oral prednisone therapy.  During the visit on 04/05/2021 was prescribed Hycodan cough syrup and albuterol inhaler.  Over the last few months he reports that his cough is about 50% better but that he continues to have a similar productive cough with yellow mucus and yellow nasal drainage.  Denies fevers, chills, SOB.    Observations/Objective: Patient sounds cheerful and well on the phone. I do not appreciate any SOB. Speech and thought processing are grossly intact. Patient reported vitals:  Assessment and Plan: 1. Chronic cough -Likely long COVID symptoms.  Due to duration will order chest x-ray.  Consider antibiotic and/or second course of prednisone therapy.  We will wait  on chest x-ray results to decide on treatment. - DG Chest 2 View; Future   Follow Up Instructions:   I did not refer this patient for an OV in the next 24 hours for this/these issue(s).  I discussed the assessment and treatment plan with the patient. The patient was provided an opportunity to ask questions and all were answered. The patient agreed with the plan and demonstrated an understanding of the instructions.   The patient was advised to call back or seek an in-person evaluation if the symptoms worsen or if the condition fails to improve as anticipated.  I provided 16 minutes of non-face-to-face time during this encounter.   Shirline Frees, NP

## 2021-04-27 NOTE — Telephone Encounter (Signed)
Pt scheduled an appt °

## 2021-04-28 ENCOUNTER — Other Ambulatory Visit: Payer: Self-pay | Admitting: Adult Health

## 2021-04-28 MED ORDER — PREDNISONE 10 MG PO TABS: ORAL_TABLET | ORAL | 0 refills | Status: DC

## 2021-06-10 ENCOUNTER — Encounter: Payer: Self-pay | Admitting: Adult Health

## 2021-06-10 ENCOUNTER — Ambulatory Visit (INDEPENDENT_AMBULATORY_CARE_PROVIDER_SITE_OTHER): Payer: BC Managed Care – PPO | Admitting: Adult Health

## 2021-06-10 ENCOUNTER — Other Ambulatory Visit: Payer: Self-pay

## 2021-06-10 VITALS — BP 130/84 | HR 74 | Temp 97.8°F | Wt 228.0 lb

## 2021-06-10 DIAGNOSIS — B356 Tinea cruris: Secondary | ICD-10-CM

## 2021-06-10 MED ORDER — TERBINAFINE HCL 250 MG PO TABS: ORAL_TABLET | ORAL | 1 refills | Status: DC

## 2021-06-10 NOTE — Progress Notes (Signed)
Subjective:    Patient ID: David Gamble, male    DOB: 18-Nov-1975, 45 y.o.   MRN: [id number]  HPI 45 year old male who  has a past medical history of Heart burn, Hemorrhoids, Overweight(278.02), RLS (restless legs syndrome), Sleep apnea, and Tonsillar hypertrophy.  45 year old male who is being evaluated today for an acute on chronic issue.  Was treated in September 2021 for tinea cruris infection.  Reports over the last month developed a red itchy rash in his groin and his perineal area.  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  He does not change in between jobs in the gym.  He does use powder to help keep himself dry.  Review of Systems See HPI   Past Medical History:  Diagnosis Date   Heart burn    Hemorrhoids    Overweight(278.02)    RLS (restless legs syndrome)    Sleep apnea    uses CPAP, followed by Dr Hennie Duos hypertrophy     Social History   Socioeconomic History   Marital status: Single    Spouse name: Not on file   Number of children: 1   Years of education: Not on file   Highest education level: Not on file  Occupational History   Not on file  Tobacco Use   Smoking status: Never   Smokeless tobacco: Never  Vaping Use   Vaping Use: Never used  Substance and Sexual Activity   Alcohol use: No   Drug use: No   Sexual activity: Not on file  Other Topics Concern   Not on file  Social History Narrative   He works for [employer] -    One child ( 56)    [place] native    Social Determinants of Corporate investment banker Strain: Not on Ship broker Insecurity: Not on file  Transportation Needs: Not on file  Physical Activity: Not on file  Stress: Not on file  Social Connections: Not on file  Intimate Partner Violence: Not on file    Past Surgical History:  Procedure Laterality Date   FINGER SURGERY   2005   ORIF of 5th digit L hand   TONSILLECTOMY  2009    Family History  Problem Relation Age of Onset   COPD Other    Lung cancer Other    Cirrhosis Other    Hypertension Mother    Depression Mother    Cirrhosis Father    Alcohol abuse Father    Cancer Paternal Grandmother    Alzheimer's disease Paternal Grandfather     No Known Allergies  Current Outpatient Medications on File Prior to Visit  Medication Sig Dispense Refill   albuterol (VENTOLIN HFA) 108 (90 Base) MCG/ACT inhaler TAKE 2 PUFFS BY MOUTH EVERY 6 HOURS AS NEEDED FOR WHEEZE OR SHORTNESS OF BREATH 18 each 1   fluticasone (FLONASE) 50 MCG/ACT nasal spray Place 2 sprays into both nostrils daily. 16 g 6   meloxicam (MOBIC) 15 MG tablet TAKE 1 TABLET BY MOUTH EVERY DAY 30 tablet 2   predniSONE (DELTASONE) 10 MG tablet 40 mg x 3 days, 20 mg x 3 days, 10 mg x 3 days 21 tablet 0   rosuvastatin (CRESTOR) 10 MG tablet Take 1 tablet (10 mg total) by mouth daily. 90  tablet 3   Spacer/Aero-Holding Chambers DEVI Use with inhaler 1 each 0   tadalafil (CIALIS) 20 MG tablet Take by mouth.     testosterone cypionate (DEPOTESTOSTERONE CYPIONATE) 200 MG/ML injection compounded with gso includes blue10kit 0.6 cc     traZODone (DESYREL) 50 MG tablet Take 1 tablet (50 mg total) by mouth at bedtime as needed for sleep. 90 tablet 1   No current facility-administered medications on file prior to visit.    BP 130/84   Pulse 74   Temp 97.8 F (36.6 C) (Oral)   Wt 228 lb (103.4 kg)   SpO2 95%   BMI 29.47 kg/m       Objective:   Physical Exam Vitals and nursing note reviewed.  Cardiovascular:     Rate and Rhythm: Normal rate and regular rhythm.     Pulses: Normal pulses.     Heart sounds: Normal heart sounds.  Pulmonary:     Effort: Pulmonary effort is normal.     Breath sounds: Normal breath sounds.  Genitourinary:    Comments: Flat red rash noted in groin and perineal area Musculoskeletal:        General: Normal range of  motion.  Skin:    General: Skin is warm and dry.     Capillary Refill: Capillary refill takes less than 2 seconds.  Neurological:     General: No focal deficit present.     Mental Status: He is oriented to person, place, and time.  Psychiatric:        Mood and Affect: Mood normal.        Behavior: Behavior normal.        Thought Content: Thought content normal.        Judgment: Judgment normal.      Assessment & Plan:  1. Tinea cruris -We will treat with Lamisil.  Also advised to change his underwear after working out, before he goes to his second job.  Needs to keep the area clean and dry.  Follow-up as needed - terbinafine (LAMISIL) 250 MG tablet; Take one tablet daily x 1-2 weeks as needed  Dispense: 30 tablet; Refill: 1  Shirline Frees, NP

## 2021-06-14 ENCOUNTER — Encounter: Payer: Self-pay | Admitting: Gastroenterology

## 2021-07-13 ENCOUNTER — Ambulatory Visit (AMBULATORY_SURGERY_CENTER): Payer: BC Managed Care – PPO

## 2021-07-13 ENCOUNTER — Other Ambulatory Visit: Payer: Self-pay

## 2021-07-13 VITALS — Ht 74.0 in | Wt 222.0 lb

## 2021-07-13 DIAGNOSIS — Z1211 Encounter for screening for malignant neoplasm of colon: Secondary | ICD-10-CM

## 2021-07-13 MED ORDER — PLENVU 140 G PO SOLR: 1.0000 | ORAL | 0 refills | Status: DC

## 2021-07-13 NOTE — Progress Notes (Signed)
Pre visit completed via phone call; °Patient verified name, DOB, and address; °No egg or soy allergy known to patient  °No issues known to pt with past sedation with any surgeries or procedures °Patient denies ever being told they had issues or difficulty with intubation  °No FH of Malignant Hyperthermia °Pt is not on diet pills °Pt is not on home 02  °Pt is not on blood thinners  °Pt denies issues with constipation  °No A fib or A flutter °Coupon given to pt in PV today, Code to Pharmacy and NO PA's for preps discussed with pt in PV today  °Discussed with pt there will be an out-of-pocket cost for prep and that varies from $0 to 70 +  dollars - pt verbalized understanding  °Due to the COVID-19 pandemic we are asking patients to follow certain guidelines in PV and the [facility]   °Pt aware of COVID protocols and [facility] guidelines  ° °

## 2021-07-18 ENCOUNTER — Encounter: Payer: Self-pay | Admitting: Gastroenterology

## 2021-07-27 ENCOUNTER — Telehealth: Payer: Self-pay | Admitting: Gastroenterology

## 2021-07-27 NOTE — Telephone Encounter (Signed)
Hey Dr. Tomasa Rand,   Patient called in to cancel procedure 11/10 due to work trip. Patient rescheduled for 12/12.  Thank you

## 2021-07-28 ENCOUNTER — Encounter: Payer: BC Managed Care – PPO | Admitting: Gastroenterology

## 2021-08-18 NOTE — Progress Notes (Deleted)
    Subjective:    CC: Low back pain  I, Cena Bruhn, LAT, ATC, am serving as scribe for Dr. Clementeen Graham.  HPI: Pt is a 45 y/o male presenting w/ c/o low back pain x .  He locates his pain to .  Of note, pt was seen at [facility] on 07/12/21 for L hip pain.    Radiating pain: LE numbness/tingling: Aggravating factors: Treatments tried:   Diagnostic testing: L-spine XR - 02/27/18  Pertinent review of Systems: ***  Relevant historical information: ***   Objective:   There were no vitals filed for this visit. General: Well Developed, well nourished, and in no acute distress.   MSK: ***  Lab and Radiology Results No results found for this or any previous visit (from the past 72 hour(s)). No results found.    Impression and Recommendations:    Assessment and Plan: 45 y.o. male with ***.  PDMP not reviewed this encounter. No orders of the defined types were placed in this encounter.  No orders of the defined types were placed in this encounter.   Discussed warning signs or symptoms. Please see discharge instructions. Patient expresses understanding.   ***

## 2021-08-22 ENCOUNTER — Ambulatory Visit: Payer: BC Managed Care – PPO | Admitting: Family Medicine

## 2021-08-29 ENCOUNTER — Encounter: Payer: BC Managed Care – PPO | Admitting: Gastroenterology

## 2021-09-02 ENCOUNTER — Encounter: Payer: BC Managed Care – PPO | Admitting: Gastroenterology

## 2021-09-15 ENCOUNTER — Ambulatory Visit: Payer: BC Managed Care – PPO | Admitting: Adult Health

## 2021-09-30 ENCOUNTER — Ambulatory Visit: Payer: BC Managed Care – PPO | Admitting: Adult Health

## 2021-10-01 ENCOUNTER — Other Ambulatory Visit: Payer: Self-pay | Admitting: Adult Health

## 2021-10-01 DIAGNOSIS — Z0000 Encounter for general adult medical examination without abnormal findings: Secondary | ICD-10-CM

## 2021-10-01 DIAGNOSIS — E782 Mixed hyperlipidemia: Secondary | ICD-10-CM

## 2021-10-01 DIAGNOSIS — G4701 Insomnia due to medical condition: Secondary | ICD-10-CM

## 2021-10-19 ENCOUNTER — Telehealth: Payer: Self-pay | Admitting: Adult Health

## 2021-10-19 NOTE — Telephone Encounter (Signed)
Called pt due to pt calling the after hours triage line wanting to change appt. To virtual. Pt was wanting a refill on gabapentin. Message was giving to [facility] verbally and he stated that gabapentin was not prescribed by him. Per Tommi Rumps pt will need to see neurology for refill. Called back and advised of update. Pt stated that it is hard to get in to see neuro. Pt wanting to keep appt. For tomorrow to see if Tommi Rumps could prescribed something. Okay to keep appt. per Tommi Rumps.

## 2021-10-19 NOTE — Telephone Encounter (Signed)
Pt call and stated he is returning your call and want a call back. 

## 2021-10-20 ENCOUNTER — Ambulatory Visit (INDEPENDENT_AMBULATORY_CARE_PROVIDER_SITE_OTHER): Payer: BC Managed Care – PPO | Admitting: Adult Health

## 2021-10-20 VITALS — BP 110/82 | HR 77 | Temp 98.4°F | Ht 74.0 in | Wt 232.0 lb

## 2021-10-20 DIAGNOSIS — G2581 Restless legs syndrome: Secondary | ICD-10-CM | POA: Diagnosis not present

## 2021-10-20 DIAGNOSIS — K649 Unspecified hemorrhoids: Secondary | ICD-10-CM | POA: Diagnosis not present

## 2021-10-20 LAB — CBC WITH DIFFERENTIAL/PLATELET
Basophils Absolute: 0.1 10*3/uL (ref 0.0–0.1)
Basophils Relative: 0.7 % (ref 0.0–3.0)
Eosinophils Absolute: 0.1 10*3/uL (ref 0.0–0.7)
Eosinophils Relative: 1.3 % (ref 0.0–5.0)
HCT: 47.8 % (ref 39.0–52.0)
Hemoglobin: 16.1 g/dL (ref 13.0–17.0)
Lymphocytes Relative: 15.3 % (ref 12.0–46.0)
Lymphs Abs: 1.4 10*3/uL (ref 0.7–4.0)
MCHC: 33.8 g/dL (ref 30.0–36.0)
MCV: 87 fl (ref 78.0–100.0)
Monocytes Absolute: 0.6 10*3/uL (ref 0.1–1.0)
Monocytes Relative: 6.1 % (ref 3.0–12.0)
Neutro Abs: 7 10*3/uL (ref 1.4–7.7)
Neutrophils Relative %: 76.6 % (ref 43.0–77.0)
Platelets: 203 10*3/uL (ref 150.0–400.0)
RBC: 5.49 Mil/uL (ref 4.22–5.81)
RDW: 13.7 % (ref 11.5–15.5)
WBC: 9.1 10*3/uL (ref 4.0–10.5)

## 2021-10-20 LAB — IBC + FERRITIN
Ferritin: 297.3 ng/mL (ref 22.0–322.0)
Iron: 153 ug/dL (ref 42–165)
Saturation Ratios: 48.8 % (ref 20.0–50.0)
TIBC: 313.6 ug/dL (ref 250.0–450.0)
Transferrin: 224 mg/dL (ref 212.0–360.0)

## 2021-10-20 MED ORDER — GABAPENTIN 300 MG PO CAPS
300.0000 mg | ORAL_CAPSULE | Freq: Every day | ORAL | 0 refills | Status: DC
Start: 2021-10-20 — End: 2022-02-10

## 2021-10-20 NOTE — Progress Notes (Signed)
Subjective:    Patient ID: David Gamble, male    DOB: Sep 25, 1975, 46 y.o.   MRN: [id number]  HPI  46 year old male who  has a past medical history of Arthritis, Heart burn, Hemorrhoids, Hyperlipidemia, Overweight(278.02), RLS (restless legs syndrome), Sleep apnea, and Tonsillar hypertrophy.  He presents to the office to discuss a chronic issue of restless leg syndrome.  In the past he has been seen by neurology, last was in 2021.  Was given a prescription for gabapentin but never had it filled and was also on Requip but stopped taking this after a week due to not seeing results.  He is currently in a relationship and he is keeping his significant other up throughout the night due to his restless leg syndrome.  He is interested in trying a medication to help with his symptoms.  His symptoms happen mostly at night but does report that during the day he feels a constant need to move his legs from time to time.  Additionally he continues to have issues with external hemorrhoids, such as bleeding, pain, itching, burning.  He would like to be referred for hemorrhoid surgery.    Review of Systems See HPI   Past Medical History:  Diagnosis Date   Arthritis    lower back/ RIGHT hip   Heart burn    Hemorrhoids    Hyperlipidemia    on meds   Overweight(278.02)    RLS (restless legs syndrome)    Sleep apnea    does not use CPAP   Tonsillar hypertrophy     Social History   Socioeconomic History   Marital status: Single    Spouse name: Not on file   Number of children: 1   Years of education: Not on file   Highest education level: Not on file  Occupational History   Not on file  Tobacco Use   Smoking status: Never   Smokeless tobacco: Never  Vaping Use   Vaping Use: Never used  Substance and Sexual Activity   Alcohol use: No   Drug use: No   Sexual activity: Not on file  Other Topics Concern   Not on file  Social History Narrative   He works for [employer] -    One child (  80)    [place] native    Social Determinants of Radio broadcast assistant Strain: Not on Art therapist Insecurity: Not on file  Transportation Needs: Not on file  Physical Activity: Not on file  Stress: Not on file  Social Connections: Not on file  Intimate Partner Violence: Not on file    Past Surgical History:  Procedure Laterality Date   FINGER SURGERY  2005   ORIF of 5th digit L hand   TONSILLECTOMY  2009    Family History  Problem Relation Age of Onset   Hypertension Mother    Depression Mother    Cirrhosis Father    Alcohol abuse Father    Cancer Paternal Grandmother    Alzheimer's disease Paternal Grandfather    COPD Other    Lung cancer Other    Cirrhosis Other    Colon polyps Neg Hx    Esophageal cancer Neg Hx    Rectal cancer Neg Hx    Stomach cancer Neg Hx     No Known Allergies  Current Outpatient Medications on File Prior to Visit  Medication Sig Dispense Refill   meloxicam (MOBIC) 15 MG tablet TAKE 1 TABLET  BY MOUTH EVERY DAY 30 tablet 2   Omega-3 Fatty Acids (FISH OIL PO) Take 1 capsule by mouth daily at 6 (six) AM.     PEG-KCl-NaCl-NaSulf-Na Asc-C (PLENVU) 140 g SOLR Take 1 kit by mouth as directed. Manufacturer's coupon Universal coupon code:BIN: P2366821; GROUP: [id number]; PCN: [id number]; ID: [id number]; PAY NO MORE $50; NO prior authorization 1 each 0   rosuvastatin (CRESTOR) 10 MG tablet Take 1 tablet (10 mg total) by mouth daily. 90 tablet 3   tadalafil (CIALIS) 20 MG tablet Take 10 mg by mouth daily as needed.     testosterone cypionate (DEPOTESTOSTERONE CYPIONATE) 200 MG/ML injection compounded with gso includes blue10kit 0.6 cc     traZODone (DESYREL) 50 MG tablet TAKE 1 TABLET BY MOUTH AT BEDTIME AS NEEDED FOR SLEEP. 90 tablet 1   VITAMIN D PO Take 1 capsule by mouth daily at 6 (six) AM.     No current facility-administered medications on file prior to visit.    BP 110/82    Pulse 77    Temp 98.4 F (36.9 C) (Oral)    Ht 6' 2" (1.88 m)     Wt 232 lb (105.2 kg)    SpO2 95%    BMI 29.79 kg/m       Objective:   Physical Exam Vitals and nursing note reviewed.  Constitutional:      Appearance: Normal appearance.  Skin:    General: Skin is warm and dry.     Capillary Refill: Capillary refill takes less than 2 seconds.  Neurological:     General: No focal deficit present.     Mental Status: He is alert and oriented to person, place, and time.  Psychiatric:        Mood and Affect: Mood normal.        Behavior: Behavior normal.        Thought Content: Thought content normal.        Judgment: Judgment normal.       Assessment & Plan:  1. Restless leg syndrome - Will check iron levels today and start with gabapentin 300 mg QHS. Can titrate if needed - gabapentin (NEURONTIN) 300 MG capsule; Take 1 capsule (300 mg total) by mouth at bedtime.  Dispense: 90 capsule; Refill: 0 - IBC + Ferritin; Future - CBC with Differential/Platelet; Future - CBC with Differential/Platelet - IBC + Ferritin  2. Hemorrhoids, unspecified hemorrhoid type  - Ambulatory referral to [facility], NP

## 2021-10-21 ENCOUNTER — Ambulatory Visit: Payer: BC Managed Care – PPO | Admitting: Adult Health

## 2021-11-03 ENCOUNTER — Ambulatory Visit: Payer: BC Managed Care – PPO | Admitting: Adult Health

## 2021-11-10 ENCOUNTER — Ambulatory Visit: Payer: BC Managed Care – PPO | Admitting: Adult Health

## 2021-11-18 ENCOUNTER — Ambulatory Visit: Payer: BC Managed Care – PPO | Admitting: Podiatry

## 2021-11-25 ENCOUNTER — Ambulatory Visit: Payer: BC Managed Care – PPO | Admitting: Podiatry

## 2021-12-12 ENCOUNTER — Ambulatory Visit: Payer: BC Managed Care – PPO | Admitting: Podiatry

## 2021-12-27 ENCOUNTER — Ambulatory Visit: Payer: BC Managed Care – PPO | Admitting: Neurology

## 2022-01-03 ENCOUNTER — Encounter: Payer: Self-pay | Admitting: Adult Health

## 2022-02-10 ENCOUNTER — Other Ambulatory Visit: Payer: Self-pay | Admitting: Adult Health

## 2022-02-10 DIAGNOSIS — G2581 Restless legs syndrome: Secondary | ICD-10-CM

## 2022-02-21 ENCOUNTER — Encounter: Payer: BC Managed Care – PPO | Admitting: Adult Health

## 2022-02-21 NOTE — Progress Notes (Deleted)
Subjective:    Patient ID: David Gamble, male    DOB: 08/20/1976, 46 y.o.   MRN: [id number]  HPI Patient presents for yearly preventative medicine examination. He is a pleasant 46 year old male who  has a past medical history of Arthritis, Heart burn, Hemorrhoids, Hyperlipidemia, Overweight(278.02), RLS (restless legs syndrome), Sleep apnea, and Tonsillar hypertrophy.  Hyperlipidemia -takes Crestor 10 mg daily.  He denies myalgia or fatigue Lab Results  Component Value Date   CHOL 215 (H) 02/21/2021   HDL 37.30 (L) 02/21/2021   LDLCALC 162 (H) 02/21/2021   LDLDIRECT 164.7 10/24/2007   TRIG 80.0 02/21/2021   CHOLHDL 6 02/21/2021   Hypogonadism -is receiving testosterone injections through [facility].  OSA   Insomnia -prescribed trazodone 50 mg nightly.  Restless Leg Syndrome - managed with Gabapentin 300 mg QHS.   Chronic low back pain- uses Mobic PRN    All immunizations and health maintenance protocols were reviewed with the patient and needed orders were placed.  Appropriate screening laboratory values were ordered for the patient including screening of hyperlipidemia, renal function and hepatic function. If indicated by BPH, a PSA was ordered.  Medication reconciliation,  past medical history, social history, problem list and allergies were reviewed in detail with the patient  Goals were established with regard to weight loss, exercise, and  diet in compliance with medications Wt Readings from Last 3 Encounters:  10/20/21 232 lb (105.2 kg)  07/13/21 222 lb (100.7 kg)  06/10/21 228 lb (103.4 kg)   Review of Systems  Constitutional: Negative.   HENT: Negative.    Eyes: Negative.   Respiratory: Negative.    Cardiovascular: Negative.   Gastrointestinal: Negative.   Endocrine: Negative.   Genitourinary: Negative.   Musculoskeletal: Negative.   Skin: Negative.   Allergic/Immunologic: Negative.   Neurological: Negative.   Hematological:  Negative.   Psychiatric/Behavioral: Negative.    All other systems reviewed and are negative.  Past Medical History:  Diagnosis Date   Arthritis    lower back/ RIGHT hip   Heart burn    Hemorrhoids    Hyperlipidemia    on meds   Overweight(278.02)    RLS (restless legs syndrome)    Sleep apnea    does not use CPAP   Tonsillar hypertrophy     Social History   Socioeconomic History   Marital status: Single    Spouse name: Not on file   Number of children: 1   Years of education: Not on file   Highest education level: Not on file  Occupational History   Not on file  Tobacco Use   Smoking status: Never   Smokeless tobacco: Never  Vaping Use   Vaping Use: Never used  Substance and Sexual Activity   Alcohol use: No   Drug use: No   Sexual activity: Not on file  Other Topics Concern   Not on file  Social History Narrative   He works for [employer] -    One child ( 11)    [place] native    Social Determinants of Radio broadcast assistant Strain: Not on file  Food Insecurity: Not on file  Transportation Needs: Not on file  Physical Activity: Not on file  Stress: Not on file  Social Connections: Not on file  Intimate Partner Violence: Not on file    Past Surgical History:  Procedure Laterality Date   FINGER SURGERY  2005   ORIF of 5th digit L  hand   TONSILLECTOMY  2009    Family History  Problem Relation Age of Onset   Hypertension Mother    Depression Mother    Cirrhosis Father    Alcohol abuse Father    Cancer Paternal Grandmother    Alzheimer's disease Paternal Grandfather    COPD Other    Lung cancer Other    Cirrhosis Other    Colon polyps Neg Hx    Esophageal cancer Neg Hx    Rectal cancer Neg Hx    Stomach cancer Neg Hx     No Known Allergies  Current Outpatient Medications on File Prior to Visit  Medication Sig Dispense Refill   gabapentin (NEURONTIN) 300 MG capsule TAKE 1 CAPSULE BY MOUTH EVERYDAY AT BEDTIME 90 capsule 0    meloxicam (MOBIC) 15 MG tablet TAKE 1 TABLET BY MOUTH EVERY DAY 30 tablet 2   Omega-3 Fatty Acids (FISH OIL PO) Take 1 capsule by mouth daily at 6 (six) AM.     PEG-KCl-NaCl-NaSulf-Na Asc-C (PLENVU) 140 g SOLR Take 1 kit by mouth as directed. Manufacturer's coupon Universal coupon code:BIN: P2366821; GROUP: [id number]; PCN: [id number]; ID: [id number]; PAY NO MORE $50; NO prior authorization 1 each 0   rosuvastatin (CRESTOR) 10 MG tablet Take 1 tablet (10 mg total) by mouth daily. 90 tablet 3   tadalafil (CIALIS) 20 MG tablet Take 10 mg by mouth daily as needed.     testosterone cypionate (DEPOTESTOSTERONE CYPIONATE) 200 MG/ML injection compounded with gso includes blue10kit 0.6 cc     traZODone (DESYREL) 50 MG tablet TAKE 1 TABLET BY MOUTH AT BEDTIME AS NEEDED FOR SLEEP. 90 tablet 1   VITAMIN D PO Take 1 capsule by mouth daily at 6 (six) AM.     No current facility-administered medications on file prior to visit.    There were no vitals taken for this visit.      Objective:   Physical Exam Vitals and nursing note reviewed.  Constitutional:      General: He is not in acute distress.    Appearance: Normal appearance. He is well-developed and normal weight.  HENT:     Head: Normocephalic and atraumatic.     Right Ear: Tympanic membrane, ear canal and external ear normal. There is no impacted cerumen.     Left Ear: Tympanic membrane, ear canal and external ear normal. There is no impacted cerumen.     Nose: Nose normal. No congestion or rhinorrhea.     Mouth/Throat:     Mouth: Mucous membranes are moist.     Pharynx: Oropharynx is clear. No oropharyngeal exudate or posterior oropharyngeal erythema.  Eyes:     General:        Right eye: No discharge.        Left eye: No discharge.     Extraocular Movements: Extraocular movements intact.     Conjunctiva/sclera: Conjunctivae normal.     Pupils: Pupils are equal, round, and reactive to light.  Neck:     Vascular: No carotid bruit.      Trachea: No tracheal deviation.  Cardiovascular:     Rate and Rhythm: Normal rate and regular rhythm.     Pulses: Normal pulses.     Heart sounds: Normal heart sounds. No murmur heard.   No friction rub. No gallop.  Pulmonary:     Effort: Pulmonary effort is normal. No respiratory distress.     Breath sounds: Normal breath sounds. No stridor. No wheezing, rhonchi or rales.  Chest:  Chest wall: No tenderness.  Abdominal:     General: Bowel sounds are normal. There is no distension.     Palpations: Abdomen is soft. There is no mass.     Tenderness: There is no abdominal tenderness. There is no right CVA tenderness, left CVA tenderness, guarding or rebound.     Hernia: No hernia is present.  Musculoskeletal:        General: No swelling, tenderness, deformity or signs of injury. Normal range of motion.     Right lower leg: No edema.     Left lower leg: No edema.  Lymphadenopathy:     Cervical: No cervical adenopathy.  Skin:    General: Skin is warm and dry.     Capillary Refill: Capillary refill takes less than 2 seconds.     Coloration: Skin is not jaundiced or pale.     Findings: No bruising, erythema, lesion or rash.  Neurological:     General: No focal deficit present.     Mental Status: He is alert and oriented to person, place, and time.     Cranial Nerves: No cranial nerve deficit.     Sensory: No sensory deficit.     Motor: No weakness.     Coordination: Coordination normal.     Gait: Gait normal.     Deep Tendon Reflexes: Reflexes normal.  Psychiatric:        Mood and Affect: Mood normal.        Behavior: Behavior normal.        Thought Content: Thought content normal.        Judgment: Judgment normal.      Assessment & Plan:

## 2022-07-12 ENCOUNTER — Encounter: Payer: Self-pay | Admitting: Adult Health

## 2022-07-12 ENCOUNTER — Ambulatory Visit (INDEPENDENT_AMBULATORY_CARE_PROVIDER_SITE_OTHER): Payer: BC Managed Care – PPO | Admitting: Adult Health

## 2022-07-12 VITALS — BP 130/82 | HR 85 | Temp 98.4°F | Ht 74.0 in | Wt 256.0 lb

## 2022-07-12 DIAGNOSIS — B356 Tinea cruris: Secondary | ICD-10-CM

## 2022-07-12 DIAGNOSIS — K649 Unspecified hemorrhoids: Secondary | ICD-10-CM

## 2022-07-12 MED ORDER — TERBINAFINE HCL 250 MG PO TABS: 250.0000 mg | ORAL_TABLET | Freq: Every day | ORAL | 1 refills | Status: AC

## 2022-07-12 NOTE — Progress Notes (Signed)
Subjective:    Patient ID: David Gamble, male    DOB: [birth year], 46 y.o.   MRN: [id number]  HPI 46 year old male who  has a past medical history of Arthritis, Heart burn, Hemorrhoids, Hyperlipidemia, Overweight(278.02), RLS (restless legs syndrome), Sleep apnea, and Tonsillar hypertrophy.  He presents to the office today for an acute on chronic issue.  He has recurrent tinea infection in his groin.  In the past we have used oral Lamisil which worked well for him.  Would also like to be referred back to [facility] surgery to discuss hemorrhoid surgery.  He had to cancel his last appointment due to having a hip replacement.  Review of Systems See HPI   Past Medical History:  Diagnosis Date   Arthritis    lower back/ RIGHT hip   Heart burn    Hemorrhoids    Hyperlipidemia    on meds   Overweight(278.02)    RLS (restless legs syndrome)    Sleep apnea    does not use CPAP   Tonsillar hypertrophy     Social History   Socioeconomic History   Marital status: Single    Spouse name: Not on file   Number of children: 1   Years of education: Not on file   Highest education level: Not on file  Occupational History   Not on file  Tobacco Use   Smoking status: Never   Smokeless tobacco: Never  Vaping Use   Vaping Use: Never used  Substance and Sexual Activity   Alcohol use: No   Drug use: No   Sexual activity: Not on file  Other Topics Concern   Not on file  Social History Narrative   He works for [employer] -    One child ( 53)    [place] native    Social Determinants of Radio broadcast assistant Strain: Not on Art therapist Insecurity: Not on file  Transportation Needs: Not on file  Physical Activity: Not on file  Stress: Not on file  Social Connections: Not on file  Intimate Partner Violence: Not on file    Past Surgical History:  Procedure Laterality Date   FINGER SURGERY  2005   ORIF of 5th digit L hand   TONSILLECTOMY  2009    Family  History  Problem Relation Age of Onset   Hypertension Mother    Depression Mother    Cirrhosis Father    Alcohol abuse Father    Cancer Paternal Grandmother    Alzheimer's disease Paternal Grandfather    COPD Other    Lung cancer Other    Cirrhosis Other    Colon polyps Neg Hx    Esophageal cancer Neg Hx    Rectal cancer Neg Hx    Stomach cancer Neg Hx     No Known Allergies  Current Outpatient Medications on File Prior to Visit  Medication Sig Dispense Refill   gabapentin (NEURONTIN) 300 MG capsule TAKE 1 CAPSULE BY MOUTH EVERYDAY AT BEDTIME 90 capsule 0   meloxicam (MOBIC) 15 MG tablet TAKE 1 TABLET BY MOUTH EVERY DAY 30 tablet 2   Omega-3 Fatty Acids (FISH OIL PO) Take 1 capsule by mouth daily at 6 (six) AM.     PEG-KCl-NaCl-NaSulf-Na Asc-C (PLENVU) 140 g SOLR Take 1 kit by mouth as directed. Manufacturer's coupon Universal coupon code:BIN: [id number]; GROUP: [id number]; PCN: [id number]; ID: [id number]; PAY NO MORE $50; NO prior authorization 1 each 0   rosuvastatin (CRESTOR)  10 MG tablet Take 1 tablet (10 mg total) by mouth daily. 90 tablet 3   tadalafil (CIALIS) 20 MG tablet Take 10 mg by mouth daily as needed.     testosterone cypionate (DEPOTESTOSTERONE CYPIONATE) 200 MG/ML injection compounded with gso includes blue10kit 0.6 cc     traZODone (DESYREL) 50 MG tablet TAKE 1 TABLET BY MOUTH AT BEDTIME AS NEEDED FOR SLEEP. 90 tablet 1   VITAMIN D PO Take 1 capsule by mouth daily at 6 (six) AM.     No current facility-administered medications on file prior to visit.    BP 130/82   Pulse 85   Temp 98.4 F (36.9 C) (Oral)   Ht 6' 2"  (1.88 m)   Wt 256 lb (116.1 kg)   SpO2 96%   BMI 32.87 kg/m       Objective:   Physical Exam Vitals and nursing note reviewed.  Constitutional:      Appearance: Normal appearance.  Cardiovascular:     Rate and Rhythm: Normal rate and regular rhythm.     Pulses: Normal pulses.     Heart sounds: Normal heart sounds.  Skin:    General: Skin is  warm and dry.     Findings: Erythema and rash present.  Neurological:     General: No focal deficit present.     Mental Status: He is alert and oriented to person, place, and time.  Psychiatric:        Mood and Affect: Mood normal.        Behavior: Behavior normal.        Thought Content: Thought content normal.       Assessment & Plan:  1. Tinea cruris  - terbinafine (LAMISIL) 250 MG tablet; Take 1 tablet (250 mg total) by mouth daily.  Dispense: 30 tablet; Refill: 1  2. Hemorrhoids, unspecified hemorrhoid type  - Ambulatory referral to [place], NP

## 2022-07-25 ENCOUNTER — Ambulatory Visit: Payer: BC Managed Care – PPO | Admitting: Adult Health

## 2022-07-27 ENCOUNTER — Ambulatory Visit: Payer: BC Managed Care – PPO | Admitting: Adult Health

## 2022-08-29 ENCOUNTER — Encounter: Payer: Self-pay | Admitting: Adult Health

## 2022-08-29 ENCOUNTER — Ambulatory Visit: Payer: BC Managed Care – PPO | Admitting: Adult Health

## 2022-08-29 VITALS — BP 122/80 | HR 75 | Temp 97.7°F | Ht 74.0 in | Wt 261.0 lb

## 2022-08-29 DIAGNOSIS — J988 Other specified respiratory disorders: Secondary | ICD-10-CM | POA: Diagnosis not present

## 2022-08-29 DIAGNOSIS — B9789 Other viral agents as the cause of diseases classified elsewhere: Secondary | ICD-10-CM

## 2022-08-29 LAB — POCT INFLUENZA A/B
Influenza A, POC: NEGATIVE
Influenza B, POC: NEGATIVE

## 2022-08-29 LAB — POC COVID19 BINAXNOW: SARS Coronavirus 2 Ag: NEGATIVE

## 2022-08-29 MED ORDER — BENZONATATE 200 MG PO CAPS
200.0000 mg | ORAL_CAPSULE | Freq: Two times a day (BID) | ORAL | 0 refills | Status: DC | PRN
Start: 2022-08-29 — End: 2023-01-09

## 2022-08-29 MED ORDER — PREDNISONE 20 MG PO TABS: 20.0000 mg | ORAL_TABLET | Freq: Every day | ORAL | 0 refills | Status: DC

## 2022-08-29 MED ORDER — HYDROCODONE BIT-HOMATROP MBR 5-1.5 MG/5ML PO SOLN
5.0000 mL | Freq: Three times a day (TID) | ORAL | 0 refills | Status: DC | PRN
Start: 2022-08-29 — End: 2023-01-09

## 2022-08-29 NOTE — Progress Notes (Signed)
Subjective:    Patient ID: David Gamble, male    DOB: December 27, 1975, 46 y.o.   MRN: [id number]  Cough   46 year old male who  has a past medical history of Arthritis, Heart burn, Hemorrhoids, Hyperlipidemia, Overweight(278.02), RLS (restless legs syndrome), Sleep apnea, and Tonsillar hypertrophy.  He presents to the office today for an acute issue. His symptoms have been present for 3 days. Symptoms include a dry cough and chest congestion.   He denies fevers, chills, wheezing, shortness of breath, sinus pain or pressure.   Review of Systems  Respiratory:  Positive for cough.    See HPI   Past Medical History:  Diagnosis Date   Arthritis    lower back/ RIGHT hip   Heart burn    Hemorrhoids    Hyperlipidemia    on meds   Overweight(278.02)    RLS (restless legs syndrome)    Sleep apnea    does not use CPAP   Tonsillar hypertrophy     Social History   Socioeconomic History   Marital status: Single    Spouse name: Not on file   Number of children: 1   Years of education: Not on file   Highest education level: Not on file  Occupational History   Not on file  Tobacco Use   Smoking status: Never   Smokeless tobacco: Never  Vaping Use   Vaping Use: Never used  Substance and Sexual Activity   Alcohol use: No   Drug use: No   Sexual activity: Not on file  Other Topics Concern   Not on file  Social History Narrative   He works for [employer] -    One child ( 41)    [place] native    Social Determinants of Radio broadcast assistant Strain: Not on Art therapist Insecurity: Not on file  Transportation Needs: Not on file  Physical Activity: Not on file  Stress: Not on file  Social Connections: Not on file  Intimate Partner Violence: Not on file    Past Surgical History:  Procedure Laterality Date   FINGER SURGERY  2005   ORIF of 5th digit L hand   TONSILLECTOMY  2009    Family History  Problem Relation Age of Onset   Hypertension Mother     Depression Mother    Cirrhosis Father    Alcohol abuse Father    Cancer Paternal Grandmother    Alzheimer's disease Paternal Grandfather    COPD Other    Lung cancer Other    Cirrhosis Other    Colon polyps Neg Hx    Esophageal cancer Neg Hx    Rectal cancer Neg Hx    Stomach cancer Neg Hx     No Known Allergies  Current Outpatient Medications on File Prior to Visit  Medication Sig Dispense Refill   gabapentin (NEURONTIN) 300 MG capsule TAKE 1 CAPSULE BY MOUTH EVERYDAY AT BEDTIME 90 capsule 0   meloxicam (MOBIC) 15 MG tablet TAKE 1 TABLET BY MOUTH EVERY DAY 30 tablet 2   Omega-3 Fatty Acids (FISH OIL PO) Take 1 capsule by mouth daily at 6 (six) AM.     PEG-KCl-NaCl-NaSulf-Na Asc-C (PLENVU) 140 g SOLR Take 1 kit by mouth as directed. Manufacturer's coupon Universal coupon code:BIN: [id number]; GROUP: [id number]; PCN: [id number]; ID: [id number]; PAY NO MORE $50; NO prior authorization 1 each 0   rosuvastatin (CRESTOR) 10 MG tablet Take 1 tablet (10 mg total) by mouth daily. [place]  tablet 3   tadalafil (CIALIS) 20 MG tablet Take 10 mg by mouth daily as needed.     testosterone cypionate (DEPOTESTOSTERONE CYPIONATE) 200 MG/ML injection compounded with gso includes blue10kit 0.6 cc     traZODone (DESYREL) 50 MG tablet TAKE 1 TABLET BY MOUTH AT BEDTIME AS NEEDED FOR SLEEP. 90 tablet 1   VITAMIN D PO Take 1 capsule by mouth daily at 6 (six) AM.     No current facility-administered medications on file prior to visit.    BP 122/80   Pulse 75   Temp 97.7 F (36.5 C) (Oral)   Ht _0  (1.88 m)   Wt 261 lb (118.4 kg)   SpO2 97%   BMI 33.51 kg/m       Objective:   Physical Exam Vitals and nursing note reviewed.  Constitutional:      Appearance: Normal appearance.  HENT:     Nose: Nose normal. No congestion or rhinorrhea.     Mouth/Throat:     Mouth: Mucous membranes are moist.     Pharynx: Oropharynx is clear.  Cardiovascular:     Rate and Rhythm: Normal rate and regular rhythm.      Pulses: Normal pulses.     Heart sounds: Normal heart sounds.  Pulmonary:     Effort: Pulmonary effort is normal.     Breath sounds: Normal breath sounds.     Comments: Dry harsh cough noted   Neurological:     Mental Status: He is alert.       Assessment & Plan:  1. Viral respiratory infection  - POC COVID-19- negative - POC Influenza A/B - negative - Will treat due to symptoms. Advised follow up in a week if not improving or sooner if symptoms worsen and fever develops  - HYDROcodone bit-homatropine (HYCODAN) 5-1.5 MG/5ML syrup; Take 5 mLs by mouth every 8 (eight) hours as needed for cough.  Dispense: 120 mL; Refill: 0 - predniSONE (DELTASONE) 20 MG tablet; Take 1 tablet (20 mg total) by mouth daily with breakfast.  Dispense: 7 tablet; Refill: 0 - benzonatate (TESSALON) 200 MG capsule; Take 1 capsule (200 mg total) by mouth 2 (two) times daily as needed for cough.  Dispense: 20 capsule; Refill: 0  Dorothyann Peng, NP

## 2022-08-30 ENCOUNTER — Telehealth: Payer: Self-pay | Admitting: Adult Health

## 2022-08-30 NOTE — Telephone Encounter (Signed)
Letter has been sent to My[employer].

## 2022-08-30 NOTE — Telephone Encounter (Signed)
Patient's job has requested a note that states he is not contagious and safe to return to work. Pls load to his mychart account.

## 2022-08-30 NOTE — Telephone Encounter (Signed)
Pt notified of update.  

## 2022-08-30 NOTE — Telephone Encounter (Signed)
Ok for letter

## 2022-08-30 NOTE — Telephone Encounter (Signed)
Patient called to check on progress of this request

## 2022-09-04 ENCOUNTER — Telehealth: Payer: Self-pay | Admitting: Adult Health

## 2022-09-04 NOTE — Telephone Encounter (Addendum)
Pt called to ask if NP could please send a cough medication that is strong enough to help with the coughing, yet he can still take and go to work (non-drowsy)??  Please advise.  LOV:  08/29/22  [id number] - [place], [place] - [address] AT [facility] [address] Phone: [phone]  Fax: [phone]

## 2022-09-05 NOTE — Telephone Encounter (Signed)
Patient notified of update  and verbalized understanding. No further action needed.

## 2022-09-05 NOTE — Telephone Encounter (Signed)
Please advise 

## 2022-12-13 ENCOUNTER — Ambulatory Visit: Payer: BC Managed Care – PPO | Admitting: Adult Health

## 2023-01-09 ENCOUNTER — Ambulatory Visit: Payer: BC Managed Care – PPO | Admitting: Adult Health

## 2023-01-09 ENCOUNTER — Other Ambulatory Visit: Payer: Self-pay | Admitting: Adult Health

## 2023-01-09 ENCOUNTER — Encounter: Payer: Self-pay | Admitting: Adult Health

## 2023-01-09 ENCOUNTER — Ambulatory Visit (INDEPENDENT_AMBULATORY_CARE_PROVIDER_SITE_OTHER): Payer: BC Managed Care – PPO

## 2023-01-09 VITALS — BP 138/88 | HR 88 | Temp 97.8°F | Ht 74.0 in | Wt 271.0 lb

## 2023-01-09 DIAGNOSIS — Z1211 Encounter for screening for malignant neoplasm of colon: Secondary | ICD-10-CM

## 2023-01-09 DIAGNOSIS — R052 Subacute cough: Secondary | ICD-10-CM | POA: Diagnosis not present

## 2023-01-09 DIAGNOSIS — J302 Other seasonal allergic rhinitis: Secondary | ICD-10-CM | POA: Diagnosis not present

## 2023-01-09 MED ORDER — LEVALBUTEROL TARTRATE 45 MCG/ACT IN AERO: 1.0000 | INHALATION_SPRAY | Freq: Four times a day (QID) | RESPIRATORY_TRACT | 2 refills | Status: DC | PRN

## 2023-01-09 MED ORDER — ALBUTEROL SULFATE HFA 108 (90 BASE) MCG/ACT IN AERS
2.0000 | INHALATION_SPRAY | Freq: Four times a day (QID) | RESPIRATORY_TRACT | 0 refills | Status: DC | PRN
Start: 2023-01-09 — End: 2024-04-18

## 2023-01-09 MED ORDER — HYDROCODONE BIT-HOMATROP MBR 5-1.5 MG/5ML PO SOLN
5.0000 mL | Freq: Three times a day (TID) | ORAL | 0 refills | Status: DC | PRN
Start: 2023-01-09 — End: 2023-03-13

## 2023-01-09 MED ORDER — PREDNISONE 10 MG PO TABS
ORAL_TABLET | ORAL | 0 refills | Status: DC
Start: 2023-01-09 — End: 2023-01-17

## 2023-01-09 NOTE — Telephone Encounter (Signed)
Requesting a change

## 2023-01-09 NOTE — Progress Notes (Signed)
Subjective:    Patient ID: David Gamble, male    DOB: [birth year], 47 y.o.   MRN: [id number]  Cough    47 year old male who  has a past medical history of Arthritis, Heart burn, Hemorrhoids, Hyperlipidemia, Overweight(278.02), RLS (restless legs syndrome), Sleep apnea, and Tonsillar hypertrophy.  He presents to the office today for non productive cough that he has had over the last 2 months or so. Symptoms include a non productive cough, wheezing, and itchy watery eyes intermittently. Cough became worse last night   He denies shortness of breath ,fever, headache, facial pain, ear pain.  He has not been using anything OTC   He would also like to do cologuard for colon cancer screening.    Review of Systems  Respiratory:  Positive for cough.    See HPI   Past Medical History:  Diagnosis Date   Arthritis    lower back/ RIGHT hip   Heart burn    Hemorrhoids    Hyperlipidemia    on meds   Overweight(278.02)    RLS (restless legs syndrome)    Sleep apnea    does not use CPAP   Tonsillar hypertrophy     Social History   Socioeconomic History   Marital status: Single    Spouse name: Not on file   Number of children: 1   Years of education: Not on file   Highest education level: Not on file  Occupational History   Not on file  Tobacco Use   Smoking status: Never   Smokeless tobacco: Never  Vaping Use   Vaping Use: Never used  Substance and Sexual Activity   Alcohol use: No   Drug use: No   Sexual activity: Not on file  Other Topics Concern   Not on file  Social History Narrative   He works for [employer] -    One child ( 50)    [place] native    Social Determinants of Corporate investment banker Strain: Not on Ship broker Insecurity: Not on file  Transportation Needs: Not on file  Physical Activity: Not on file  Stress: Not on file  Social Connections: Not on file  Intimate Partner Violence: Not on file    Past Surgical History:  Procedure  Laterality Date   FINGER SURGERY  2005   ORIF of 5th digit L hand   TONSILLECTOMY  2009    Family History  Problem Relation Age of Onset   Hypertension Mother    Depression Mother    Cirrhosis Father    Alcohol abuse Father    Cancer Paternal Grandmother    Alzheimer's disease Paternal Grandfather    COPD Other    Lung cancer Other    Cirrhosis Other    Colon polyps Neg Hx    Esophageal cancer Neg Hx    Rectal cancer Neg Hx    Stomach cancer Neg Hx     No Known Allergies  Current Outpatient Medications on File Prior to Visit  Medication Sig Dispense Refill   gabapentin (NEURONTIN) 300 MG capsule TAKE 1 CAPSULE BY MOUTH EVERYDAY AT BEDTIME 90 capsule 0   Omega-3 Fatty Acids (FISH OIL PO) Take 1 capsule by mouth daily at 6 (six) AM.     PEG-KCl-NaCl-NaSulf-Na Asc-C (PLENVU) 140 g SOLR Take 1 kit by mouth as directed. Manufacturer's coupon Universal coupon code:BIN: [id number]; GROUP: [id number]; PCN: CNRX; ID: [id number]; PAY NO MORE $50; NO prior authorization 1 each 0  rosuvastatin (CRESTOR) 10 MG tablet Take 1 tablet (10 mg total) by mouth daily. 90 tablet 3   tadalafil (CIALIS) 20 MG tablet Take 10 mg by mouth daily as needed.     testosterone cypionate (DEPOTESTOSTERONE CYPIONATE) 200 MG/ML injection compounded with gso includes blue10kit 0.6 cc     traZODone (DESYREL) 50 MG tablet TAKE 1 TABLET BY MOUTH AT BEDTIME AS NEEDED FOR SLEEP. 90 tablet 1   VITAMIN D PO Take 1 capsule by mouth daily at 6 (six) AM.     No current facility-administered medications on file prior to visit.    BP 138/88   Pulse 88   Temp 97.8 F (36.6 C) (Oral)   Ht  (1.88 m)   Wt 271 lb (122.9 kg)   SpO2 96%   BMI 34.79 kg/m       Objective:   Physical Exam Vitals and nursing note reviewed.  Constitutional:      Appearance: Normal appearance.  Cardiovascular:     Rate and Rhythm: Normal rate and regular rhythm.     Pulses: Normal pulses.     Heart sounds: Normal heart sounds.   Pulmonary:     Effort: Pulmonary effort is normal.     Breath sounds: Normal breath sounds.  Neurological:     Mental Status: He is alert.       Assessment & Plan:  1. Seasonal allergies  - predniSONE (DELTASONE) 10 MG tablet; 40 mg x 3 days, 20 mg x 3 days, 10 mg x 3 days  Dispense: 21 tablet; Refill: 0 - albuterol (VENTOLIN HFA) 108 (90 Base) MCG/ACT inhaler; Inhale 2 puffs into the lungs every 6 (six) hours as needed for wheezing or shortness of breath.  Dispense: 8 g; Refill: 0 - start taking Zyrtec  2. Subacute cough - likely allergy mediated but will get chest xray due to time frame of cough - albuterol (VENTOLIN HFA) 108 (90 Base) MCG/ACT inhaler; Inhale 2 puffs into the lungs every 6 (six) hours as needed for wheezing or shortness of breath.  Dispense: 8 g; Refill: 0 - HYDROcodone bit-homatropine (HYCODAN) 5-1.5 MG/5ML syrup; Take 5 mLs by mouth every 8 (eight) hours as needed for cough.  Dispense: 120 mL; Refill: 0 - DG Chest 2 View; Future  3. Colon cancer screening  - Cologuard  Shirline Frees, NP  Time spent with patient today was 31 minutes which consisted of chart review, discussing allergies and colon cancer screening,  work up, treatment answering questions and documentation.

## 2023-01-10 ENCOUNTER — Ambulatory Visit: Payer: BC Managed Care – PPO | Admitting: Adult Health

## 2023-01-12 ENCOUNTER — Telehealth: Payer: Self-pay | Admitting: Adult Health

## 2023-01-12 MED ORDER — DOXYCYCLINE HYCLATE 100 MG PO CAPS: 100.0000 mg | ORAL_CAPSULE | Freq: Two times a day (BID) | ORAL | 0 refills | Status: DC

## 2023-01-12 NOTE — Telephone Encounter (Signed)
Updated patient on his xray which showed   IMPRESSION: Minimal opacity in left base is favored to represent atelectasis. Subtle infiltrate considered less likely. No other abnormalities.  He reports that he continues to not feel well and has a cough. I am going to treat him for pneumonia with doxycycline

## 2023-01-16 ENCOUNTER — Encounter: Payer: Self-pay | Admitting: Adult Health

## 2023-01-17 ENCOUNTER — Other Ambulatory Visit: Payer: Self-pay | Admitting: Adult Health

## 2023-01-17 DIAGNOSIS — R052 Subacute cough: Secondary | ICD-10-CM

## 2023-01-17 MED ORDER — PREDNISONE 20 MG PO TABS
20.0000 mg | ORAL_TABLET | Freq: Every day | ORAL | 0 refills | Status: DC
Start: 2023-01-17 — End: 2023-01-31

## 2023-01-17 MED ORDER — BENZONATATE 200 MG PO CAPS
200.0000 mg | ORAL_CAPSULE | Freq: Two times a day (BID) | ORAL | 0 refills | Status: DC | PRN
Start: 2023-01-17 — End: 2023-01-31

## 2023-01-17 NOTE — Telephone Encounter (Signed)
Please advise 

## 2023-01-31 ENCOUNTER — Ambulatory Visit: Payer: BC Managed Care – PPO | Admitting: Adult Health

## 2023-01-31 VITALS — BP 138/80 | HR 80 | Temp 98.0°F | Ht 74.0 in | Wt 277.0 lb

## 2023-01-31 DIAGNOSIS — G4733 Obstructive sleep apnea (adult) (pediatric): Secondary | ICD-10-CM | POA: Diagnosis not present

## 2023-01-31 DIAGNOSIS — R053 Chronic cough: Secondary | ICD-10-CM | POA: Diagnosis not present

## 2023-01-31 DIAGNOSIS — R052 Subacute cough: Secondary | ICD-10-CM

## 2023-01-31 LAB — COLOGUARD: COLOGUARD: NEGATIVE

## 2023-01-31 MED ORDER — BENZONATATE 200 MG PO CAPS
200.0000 mg | ORAL_CAPSULE | Freq: Three times a day (TID) | ORAL | 1 refills | Status: DC | PRN
Start: 2023-01-31 — End: 2023-06-07

## 2023-01-31 NOTE — Progress Notes (Signed)
Subjective:    Patient ID: David Gamble, male    DOB: 1976/01/11, 47 y.o.   MRN: [id number]  HPI 47 year old male who  has a past medical history of Arthritis, Heart burn, Hemorrhoids, Hyperlipidemia, Overweight(278.02), RLS (restless legs syndrome), Sleep apnea, and Tonsillar hypertrophy.  He presents to the office today for concern of sleep apnea.  He reports that his girlfriend tells him that he stops breathing while he is sleeping.He does snore.He also reports chronic fatigue. He has not fallen asleep while driving but did fall asleep while talking on the phone. He did have a sleep study done in 2014 which showed OSA and he was given a CPAP. He did not wear it frequently. Repeat sleep study in 2018 showed sleep apnea when sleeping in certain positions but did not sound like he needed CPAP.   Other more he reports that he continues to have a cough that we have been treating for three months.   He has not had any fevers, chills, wheezing, shortness of breath, sinus pain or pressure.  Was recently was seen a month ago at which time we treated him for suspected seasonal allergies with prednisone and albuterol inhaler.  His chest x-ray was clear.  BP Readings from Last 3 Encounters:  01/31/23 138/80  01/09/23 138/88  08/29/22 122/80    Review of Systems See HPI   Past Medical History:  Diagnosis Date   Arthritis    lower back/ RIGHT hip   Heart burn    Hemorrhoids    Hyperlipidemia    on meds   Overweight(278.02)    RLS (restless legs syndrome)    Sleep apnea    does not use CPAP   Tonsillar hypertrophy     Social History   Socioeconomic History   Marital status: Single    Spouse name: Not on file   Number of children: 1   Years of education: Not on file   Highest education level: 12th grade  Occupational History   Not on file  Tobacco Use   Smoking status: Never   Smokeless tobacco: Never  Vaping Use   Vaping Use: Never used  Substance and Sexual Activity    Alcohol use: No   Drug use: No   Sexual activity: Not on file  Other Topics Concern   Not on file  Social History Narrative   He works for [employer] -    One child ( 25)    [place] native    Social Determinants of Health   Financial Resource Strain: Medium Risk (01/31/2023)   Overall Financial Resource Strain (CARDIA)    Difficulty of Paying Living Expenses: Somewhat hard  Food Insecurity: Food Insecurity Present (01/31/2023)   Hunger Vital Sign    Worried About Running Out of Food in the Last Year: Sometimes true    Ran Out of Food in the Last Year: Sometimes true  Transportation Needs: No Transportation Needs (01/31/2023)   PRAPARE - Administrator, Civil Service (Medical): No    Lack of Transportation (Non-Medical): No  Physical Activity: Unknown (01/31/2023)   Exercise Vital Sign    Days of Exercise per Week: 0 days    Minutes of Exercise per Session: Not on file  Stress: Stress Concern Present (01/31/2023)   [employer] of Occupational Health - Occupational Stress Questionnaire    Feeling of Stress : To some extent  Social Connections: Socially Isolated (01/31/2023)   Social Connection and Isolation Panel [NHANES]  Frequency of Communication with Friends and Family: More than three times a week    Frequency of Social Gatherings with Friends and Family: More than three times a week    Attends Religious Services: Never    Database administrator or Organizations: No    Attends Engineer, structural: Not on file    Marital Status: Never married  Catering manager Violence: Not on file    Past Surgical History:  Procedure Laterality Date   FINGER SURGERY  2005   ORIF of 5th digit L hand   TONSILLECTOMY  2009    Family History  Problem Relation Age of Onset   Hypertension Mother    Depression Mother    Cirrhosis Father    Alcohol abuse Father    Cancer Paternal Grandmother    Alzheimer's disease Paternal Grandfather    COPD Other     Lung cancer Other    Cirrhosis Other    Colon polyps Neg Hx    Esophageal cancer Neg Hx    Rectal cancer Neg Hx    Stomach cancer Neg Hx     No Known Allergies  Current Outpatient Medications on File Prior to Visit  Medication Sig Dispense Refill   albuterol (VENTOLIN HFA) 108 (90 Base) MCG/ACT inhaler Inhale 2 puffs into the lungs every 6 (six) hours as needed for wheezing or shortness of breath. 8 g 0   benzonatate (TESSALON) 200 MG capsule Take 1 capsule (200 mg total) by mouth 2 (two) times daily as needed for cough. 20 capsule 0   doxycycline (VIBRAMYCIN) 100 MG capsule Take 1 capsule (100 mg total) by mouth 2 (two) times daily. 14 capsule 0   gabapentin (NEURONTIN) 300 MG capsule TAKE 1 CAPSULE BY MOUTH EVERYDAY AT BEDTIME 90 capsule 0   HYDROcodone bit-homatropine (HYCODAN) 5-1.5 MG/5ML syrup Take 5 mLs by mouth every 8 (eight) hours as needed for cough. 120 mL 0   levalbuterol (XOPENEX HFA) 45 MCG/ACT inhaler Inhale 1-2 puffs into the lungs every 6 (six) hours as needed for wheezing. 1 each 2   Omega-3 Fatty Acids (FISH OIL PO) Take 1 capsule by mouth daily at 6 (six) AM.     PEG-KCl-NaCl-NaSulf-Na Asc-C (PLENVU) 140 g SOLR Take 1 kit by mouth as directed. Manufacturer's coupon Universal coupon code:BIN: [id number]; GROUP: [id number]; PCN: CNRX; ID: [id number]; PAY NO MORE $50; NO prior authorization 1 each 0   predniSONE (DELTASONE) 20 MG tablet Take 1 tablet (20 mg total) by mouth daily with breakfast. 5 tablet 0   rosuvastatin (CRESTOR) 10 MG tablet Take 1 tablet (10 mg total) by mouth daily. 90 tablet 3   tadalafil (CIALIS) 20 MG tablet Take 10 mg by mouth daily as needed.     testosterone cypionate (DEPOTESTOSTERONE CYPIONATE) 200 MG/ML injection compounded with gso includes blue10kit 0.6 cc     traZODone (DESYREL) 50 MG tablet TAKE 1 TABLET BY MOUTH AT BEDTIME AS NEEDED FOR SLEEP. 90 tablet 1   VITAMIN D PO Take 1 capsule by mouth daily at 6 (six) AM.     No current  facility-administered medications on file prior to visit.    BP 138/80   Pulse 80   Temp 98 F (36.7 C) (Oral)   Ht 6\' 2"  (1.88 m)   Wt 277 lb (125.6 kg)   SpO2 95%   BMI 35.56 kg/m       Objective:   Physical Exam Vitals and nursing note reviewed.  Constitutional:  Appearance: Normal appearance.  Cardiovascular:     Rate and Rhythm: Normal rate and regular rhythm.     Pulses: Normal pulses.     Heart sounds: Normal heart sounds.  Pulmonary:     Effort: Pulmonary effort is normal.     Breath sounds: Normal breath sounds.  Musculoskeletal:        General: Normal range of motion.  Skin:    General: Skin is warm and dry.  Neurological:     General: No focal deficit present.     Mental Status: He is alert and oriented to person, place, and time.       Assessment & Plan:  1. Chronic cough - possibly from OSA  - Ambulatory referral to Pulmonology  2. OSA (obstructive sleep apnea) - Stop BANG = 4  - Ambulatory referral to Pulmonology  Shirline Frees, NP

## 2023-03-13 ENCOUNTER — Ambulatory Visit (HOSPITAL_BASED_OUTPATIENT_CLINIC_OR_DEPARTMENT_OTHER): Payer: BC Managed Care – PPO

## 2023-03-13 ENCOUNTER — Ambulatory Visit: Payer: BC Managed Care – PPO | Admitting: Adult Health

## 2023-03-13 ENCOUNTER — Ambulatory Visit (INDEPENDENT_AMBULATORY_CARE_PROVIDER_SITE_OTHER): Payer: BC Managed Care – PPO | Admitting: Pulmonary Disease

## 2023-03-13 ENCOUNTER — Institutional Professional Consult (permissible substitution) (HOSPITAL_BASED_OUTPATIENT_CLINIC_OR_DEPARTMENT_OTHER): Payer: BC Managed Care – PPO | Admitting: Pulmonary Disease

## 2023-03-13 ENCOUNTER — Encounter (HOSPITAL_BASED_OUTPATIENT_CLINIC_OR_DEPARTMENT_OTHER): Payer: Self-pay | Admitting: Pulmonary Disease

## 2023-03-13 VITALS — BP 154/88 | HR 73 | Temp 98.4°F | Ht 74.0 in | Wt 279.0 lb

## 2023-03-13 DIAGNOSIS — G4733 Obstructive sleep apnea (adult) (pediatric): Secondary | ICD-10-CM

## 2023-03-13 DIAGNOSIS — R635 Abnormal weight gain: Secondary | ICD-10-CM | POA: Diagnosis not present

## 2023-03-13 DIAGNOSIS — G4719 Other hypersomnia: Secondary | ICD-10-CM

## 2023-03-13 DIAGNOSIS — R053 Chronic cough: Secondary | ICD-10-CM

## 2023-03-13 DIAGNOSIS — L409 Psoriasis, unspecified: Secondary | ICD-10-CM

## 2023-03-13 MED ORDER — OMEPRAZOLE 40 MG PO CPDR: 40.0000 mg | DELAYED_RELEASE_CAPSULE | Freq: Every day | ORAL | 2 refills | Status: DC

## 2023-03-13 MED ORDER — AZELASTINE HCL 0.1 % NA SOLN: 1.0000 | Freq: Every day | NASAL | 12 refills | Status: DC

## 2023-03-13 MED ORDER — FLUTICASONE PROPIONATE 50 MCG/ACT NA SUSP: 1.0000 | Freq: Every evening | NASAL | 5 refills | Status: DC

## 2023-03-13 NOTE — Addendum Note (Signed)
Addended by: Arlyss Repress on: 03/13/2023 09:05 AM   Modules accepted: Orders

## 2023-03-13 NOTE — Patient Instructions (Signed)
Chest xray today  Azelastine 1 spray in each nostril in the morning  Flonase 1 spray in each nostril in the evening  Continue using Navage  Omeprazole 40 mg daily, and take 30 minutes before your first meal of the day  Will arrange for referral to dermatology  Will arrange for a home sleep study  Will call to arrange for follow up after sleep study reviewed

## 2023-03-13 NOTE — Progress Notes (Addendum)
[facility]  Chief Complaint  Patient presents with   Consult    Patient here to talk about sleep.     Past Surgical History:  He  has a past surgical history that includes Tonsillectomy (2009) and Finger surgery (2005).  Past Medical History:  Arthritis, GERD, HLD, RLS, Psoriasis  Constitutional:  BP (!) 154/88 (BP Location: Right Arm, Patient Position: Sitting, Cuff Size: Normal)   Pulse 73   Temp 98.4 F (36.9 C) (Oral)   Ht 6\' 2"  (1.88 m)   Wt 279 lb (126.6 kg)   SpO2 94%   BMI 35.82 kg/m   Brief Summary:  David Gamble is a 47 y.o. male with obstructive sleep apnea.  He had UPPP in June 2009.      Subjective:   He had a sleep study in 2009.  Showed moderate obstructive sleep apnea.  Had tonsillectomy and uvulectomy in June 2009.  He had follow up sleep study in December 2017 which didn't show significant sleep apnea.  His weight at that time was 245 lbs.   He has gained about 30 lbs.  He is snoring more.  His girlfriend recording him sleeping and he stops breathing while asleep.  He is tired all the time, and has trouble staying awake whenever he is sitting quiet.  He goes to sleep at 830 pm.  He falls asleep in 15 minutes.  He wakes up 4 or 5 times to use the bathroom.  He gets out of bed at 4:445 am.  He feels exhausted in the morning.  He denies morning headache.  He uses trazodone at 7:45 pm.  He has an energy drink before going to work.  He does talk in his sleep  He denies sleep walking, bruxism, or nightmares.  He denies sleep hallucinations, sleep paralysis, or cataplexy.  The Epworth score is 16 out of 24.  He had a respiratory infection in December 2023.  He has a cough since then.  It sometimes brings up sputum, but cough is usually dry.  Doesn't have wheezing.  He has sinus congestion and post nasal drip.  Gets reflux intermittently after spicy foods, and when he lays down.  No history of smoking.  He has tried  albuterol, but didn't help much.  He uses a Navage nasal irrigation device.  He has been using claritin for the past month.  He is not using any reflux medications at this time.  Chest xray from 01/11/23 showed Lt base atelectasis.  He has psoriasis.  Has lesions on his scalp, elbows, knees, and behind his ears.  Physical Exam:   Appearance - well kempt   ENMT - no sinus tenderness, no oral exudate, no LAN, Mallampati 3 airway, no stridor, s/p UPPP  Respiratory - equal breath sounds bilaterally, no wheezing or rales  CV - s1s2 regular rate and rhythm, no murmurs  Ext - no clubbing, no edema  Skin - scaling plaques on scalp, behind ears, on elbows and knees  Psych - normal mood and affect   Pulmonary testing:    Chest Imaging:    Sleep Tests:  PSG 10/29/07 >> AHI 24, SpO2 low 79% PSG 09/01/16 >> AHI 4.8, SpO2 low 91%  Cardiac Tests:  Echo 01/05/11 >> EF 55 to 60%, mild RA/RV enlargement  Social History:  He  reports that he has never smoked. He has never used smokeless tobacco. He reports that he does not drink alcohol and does not  use drugs.  Family History:  His family history includes Alcohol abuse in his father; Alzheimer's disease in his paternal grandfather; COPD in an other family member; Cancer in his paternal grandmother; Cirrhosis in his father and another family member; Depression in his mother; Hypertension in his mother; Lung cancer in an other family member.     Assessment/Plan:   Obstructive sleep apnea. - he had UPPP in 2009 - his sleep symptoms have progressed with weight gain - he is having snoring, sleep disruption, apnea and daytime sleepiness - will arrange for a home sleep study to determine if he has significant sleep apnea again - he would be okay with trying CPAP again if needed  Elevated blood pressure. - advised him to follow up with his PCP  Chronic cough. - developed after respiratory infection in December 2023 - no history of smoking -  previous trial of albuterol didn't help much - likely from post nasal drip with allergic rhinitis and reflux - will have him try azelastine in the morning and flonase at night - continue claritin and Navage nasal irrigation - will have him try omeprazole in the morning - chest xray today - if no improvement, then will need additional testing  Psoriasis. - will arrange for referral to dermatology  Obesity. - discussed how weight can impact sleep and risk for sleep disordered breathing - discussed options to assist with weight loss: combination of diet modification, cardiovascular and strength training exercises  Cardiovascular risk. - had an extensive discussion regarding the adverse health consequences related to untreated sleep disordered breathing - specifically discussed the risks for hypertension, coronary artery disease, cardiac dysrhythmias, cerebrovascular disease, and diabetes - lifestyle modification discussed  Safe driving practices. - discussed how sleep disruption can increase risk of accidents, particularly when driving - safe driving practices were discussed  Therapies for obstructive sleep apnea. - if the sleep study shows significant sleep apnea, then various therapies for treatment were reviewed: CPAP, oral appliance, and surgical interventions  Time Spent Involved in Patient Care on Day of Examination:  65 minutes  Follow up:   Patient Instructions  Chest xray today  Azelastine 1 spray in each nostril in the morning  Flonase 1 spray in each nostril in the evening  Continue using Navage  Omeprazole 40 mg daily, and take 30 minutes before your first meal of the day  Will arrange for referral to dermatology  Will arrange for a home sleep study  Will call to arrange for follow up after sleep study reviewed   Medication List:   Allergies as of 03/13/2023   No Known Allergies      Medication List        Accurate as of March 13, 2023  8:50 AM. If  you have any questions, ask your nurse or doctor.          STOP taking these medications    doxycycline 100 MG capsule Commonly known as: VIBRAMYCIN Stopped by: Coralyn Helling, MD   FISH OIL PO Stopped by: Coralyn Helling, MD   gabapentin 300 MG capsule Commonly known as: NEURONTIN Stopped by: Coralyn Helling, MD   HYDROcodone bit-homatropine 5-1.5 MG/5ML syrup Commonly known as: HYCODAN Stopped by: Coralyn Helling, MD   tadalafil 20 MG tablet Commonly known as: CIALIS Stopped by: Coralyn Helling, MD       TAKE these medications    albuterol 108 (90 Base) MCG/ACT inhaler Commonly known as: VENTOLIN HFA Inhale 2 puffs into the lungs every 6 (six) hours  as needed for wheezing or shortness of breath.   azelastine 0.1 % nasal spray Commonly known as: ASTELIN Place 1 spray into both nostrils daily. Use in each nostril as directed Started by: Coralyn Helling, MD   benzonatate 200 MG capsule Commonly known as: TESSALON Take 1 capsule (200 mg total) by mouth 3 (three) times daily as needed for cough.   fluticasone 50 MCG/ACT nasal spray Commonly known as: FLONASE Place 1 spray into both nostrils at bedtime. Started by: Coralyn Helling, MD   levalbuterol 45 MCG/ACT inhaler Commonly known as: XOPENEX HFA Inhale 1-2 puffs into the lungs every 6 (six) hours as needed for wheezing.   omeprazole 40 MG capsule Commonly known as: PRILOSEC Take 1 capsule (40 mg total) by mouth daily. Started by: Coralyn Helling, MD   Plenvu 140 g Solr Generic drug: PEG-KCl-NaCl-NaSulf-Na Asc-C Take 1 kit by mouth as directed. Manufacturer's coupon Universal coupon code:BIN: G6837245; GROUP: [id number]; PCN: [id number]; ID: [id number]; PAY NO MORE $50; NO prior authorization   rosuvastatin 10 MG tablet Commonly known as: Crestor Take 1 tablet (10 mg total) by mouth daily.   testosterone cypionate 200 MG/ML injection Commonly known as: DEPOTESTOSTERONE CYPIONATE compounded with gso includes blue10kit 0.6 cc   traZODone  50 MG tablet Commonly known as: DESYREL TAKE 1 TABLET BY MOUTH AT BEDTIME AS NEEDED FOR SLEEP.   VITAMIN D PO Take 1 capsule by mouth daily at 6 (six) AM.        Signature:  Coralyn Helling, MD [facility]/Critical Care Pager - [phone] 03/13/2023, 8:50 AM

## 2023-04-09 ENCOUNTER — Encounter (HOSPITAL_BASED_OUTPATIENT_CLINIC_OR_DEPARTMENT_OTHER): Payer: Self-pay | Admitting: Pulmonary Disease

## 2023-04-12 MED ORDER — WIXELA INHUB 250-50 MCG/ACT IN AEPB: 1.0000 | INHALATION_SPRAY | Freq: Two times a day (BID) | RESPIRATORY_TRACT | 1 refills | Status: DC

## 2023-04-13 ENCOUNTER — Encounter (INDEPENDENT_AMBULATORY_CARE_PROVIDER_SITE_OTHER): Payer: BC Managed Care – PPO

## 2023-04-13 ENCOUNTER — Telehealth: Payer: Self-pay | Admitting: Pulmonary Disease

## 2023-04-13 DIAGNOSIS — G4733 Obstructive sleep apnea (adult) (pediatric): Secondary | ICD-10-CM | POA: Diagnosis not present

## 2023-04-13 DIAGNOSIS — G4719 Other hypersomnia: Secondary | ICD-10-CM

## 2023-04-13 DIAGNOSIS — R635 Abnormal weight gain: Secondary | ICD-10-CM

## 2023-04-13 NOTE — Telephone Encounter (Signed)
I called and left the pt a detailed msg on machine ok per DPR  The pt already has appt scheduled

## 2023-04-13 NOTE — Telephone Encounter (Signed)
HST 03/30/23 >> AHI 81.3, SpO2 low 50%.  Spent 246 min with SpO2 < 88%.   Please inform him that his sleep study shows severe obstructive sleep apnea.  Please arrange for ROV with me or NP to discuss treatment options.

## 2023-04-13 NOTE — Telephone Encounter (Signed)
Called the pt and there was no answer- LMTCB    

## 2023-06-07 ENCOUNTER — Ambulatory Visit (INDEPENDENT_AMBULATORY_CARE_PROVIDER_SITE_OTHER): Payer: BC Managed Care – PPO | Admitting: Pulmonary Disease

## 2023-06-07 ENCOUNTER — Telehealth (HOSPITAL_BASED_OUTPATIENT_CLINIC_OR_DEPARTMENT_OTHER): Payer: Self-pay

## 2023-06-07 ENCOUNTER — Encounter (HOSPITAL_BASED_OUTPATIENT_CLINIC_OR_DEPARTMENT_OTHER): Payer: Self-pay | Admitting: Pulmonary Disease

## 2023-06-07 VITALS — BP 152/90 | HR 77 | Ht 74.0 in | Wt 280.0 lb

## 2023-06-07 DIAGNOSIS — R053 Chronic cough: Secondary | ICD-10-CM | POA: Diagnosis not present

## 2023-06-07 DIAGNOSIS — G4733 Obstructive sleep apnea (adult) (pediatric): Secondary | ICD-10-CM | POA: Diagnosis not present

## 2023-06-07 DIAGNOSIS — G4734 Idiopathic sleep related nonobstructive alveolar hypoventilation: Secondary | ICD-10-CM | POA: Diagnosis not present

## 2023-06-07 NOTE — Progress Notes (Signed)
[facility]  Chief Complaint  Patient presents with   Sleep Apnea    Past Surgical History:  He  has a past surgical history that includes Tonsillectomy (2009) and Finger surgery (2005).  Past Medical History:  Arthritis, GERD, HLD, RLS, Psoriasis  Constitutional:  BP (!) 152/90   Pulse 77   Ht 6\' 2"  (1.88 m)   Wt 280 lb (127 kg)   SpO2 90%   BMI 35.95 kg/m   Brief Summary:  David Gamble is a 47 y.o. male with obstructive sleep apnea.  He had UPPP in June 2009.      Subjective:   Home sleep study showed severe sleep apnea with low oxygen.  He feels tired all the time.  Has episodes of bed wetting recently.  Still has cough but better than before.  Using inhaler and nose sprays.  These help.  Physical Exam:   Appearance - well kempt   ENMT - no sinus tenderness, no oral exudate, no LAN, Mallampati 3 airway, no stridor, s/p UPPP  Respiratory - equal breath sounds bilaterally, no wheezing or rales  CV - s1s2 regular rate and rhythm, no murmurs  Ext - no clubbing, no edema  Skin - scaling plaques on scalp, behind ears, on elbows and knees  Psych - normal mood and affect   Pulmonary testing:    Chest Imaging:    Sleep Tests:  PSG 10/29/07 >> AHI 24, SpO2 low 79% PSG 09/01/16 >> AHI 4.8, SpO2 low 91% HST 03/30/23 >> AHI 81.3, SpO2 low 50%. Spent 246 min with SpO2 < 88%.   Cardiac Tests:  Echo 01/05/11 >> EF 55 to 60%, mild RA/RV enlargement  Social History:  He  reports that he has never smoked. He has never used smokeless tobacco. He reports that he does not drink alcohol and does not use drugs.  Family History:  His family history includes Alcohol abuse in his father; Alzheimer's disease in his paternal grandfather; COPD in an other family member; Cancer in his paternal grandmother; Cirrhosis in his father and another family member; Depression in his mother; Hypertension in his mother; Lung cancer in an other  family member.     Assessment/Plan:   Obstructive sleep apnea with nocturnal hypoxemia. - he had UPPP in 2009 - reviewed his sleep study  - treatment options discussed - will arrange for auto CPAP 8 to 20 cm H2O and overnight oximetry with CPAP - he might need an in lab titration study  Nocturia. - explained how this can be related to sleep apnea - discussed avoiding fluids/caffeine/alcohol close to bed time - discuss scheduled bathroom trips prior to bedtime  Elevated blood pressure. - advised him to follow up with his PCP  Chronic cough. - developed after respiratory infection in December 2023 - no history of smoking - likely has allergic asthma and rhinitis, and reflux - continue wixela 250 one puff bid, flonase, azelastine - prn xopenex - continue prilosec  Psoriasis. - will make sure he gets referral set up for dermatology   Time Spent Involved in Patient Care on Day of Examination:  28 minutes  Follow up:   Patient Instructions  Will arrange for auto CPAP set up and overnight oxygen test with CPAP  Follow up in 4 months  Medication List:   Allergies as of 06/07/2023   No Known Allergies      Medication List        Accurate as of  June 07, 2023  3:26 PM. If you have any questions, ask your nurse or doctor.          STOP taking these medications    benzonatate 200 MG capsule Commonly known as: TESSALON Stopped by: Neko Boyajian   Plenvu 140 g Solr Generic drug: PEG-KCl-NaCl-NaSulf-Na Asc-C Stopped by: Coralyn Helling   rosuvastatin 10 MG tablet Commonly known as: Crestor Stopped by: Coralyn Helling   testosterone cypionate 200 MG/ML injection Commonly known as: DEPOTESTOSTERONE CYPIONATE Stopped by: Coralyn Helling   VITAMIN D PO Stopped by: Coralyn Helling       TAKE these medications    albuterol 108 (90 Base) MCG/ACT inhaler Commonly known as: VENTOLIN HFA Inhale 2 puffs into the lungs every 6 (six) hours as needed for wheezing or  shortness of breath.   azelastine 0.1 % nasal spray Commonly known as: ASTELIN Place 1 spray into both nostrils daily. Use in each nostril as directed   fluticasone 50 MCG/ACT nasal spray Commonly known as: FLONASE Place 1 spray into both nostrils at bedtime.   levalbuterol 45 MCG/ACT inhaler Commonly known as: XOPENEX HFA Inhale 1-2 puffs into the lungs every 6 (six) hours as needed for wheezing.   omeprazole 40 MG capsule Commonly known as: PRILOSEC Take 1 capsule (40 mg total) by mouth daily.   traZODone 50 MG tablet Commonly known as: DESYREL TAKE 1 TABLET BY MOUTH AT BEDTIME AS NEEDED FOR SLEEP.   Wixela Inhub 250-50 MCG/ACT Aepb Generic drug: fluticasone-salmeterol Inhale 1 puff into the lungs in the morning and at bedtime.        Signature:  Coralyn Helling, MD [facility] Pulmonary/Critical Care Pager - [phone] 06/07/2023, 3:26 PM

## 2023-06-07 NOTE — Patient Instructions (Signed)
Will arrange for auto CPAP set up and overnight oxygen test with CPAP  Follow up in 4 months

## 2023-06-07 NOTE — Telephone Encounter (Signed)
Referral to Derm was placed in June. Per patient he has not heard from anyone to schedule this.   Please review, thank you!

## 2023-06-14 NOTE — Telephone Encounter (Signed)
I called and spoke with the office. They shut down for a month per the lady who answered the phone to move locations and are playing catch up. Since I called about him she said they would go ahead and reach out to him and get him on the schedule.

## 2023-06-19 ENCOUNTER — Other Ambulatory Visit (HOSPITAL_BASED_OUTPATIENT_CLINIC_OR_DEPARTMENT_OTHER): Payer: Self-pay | Admitting: Pulmonary Disease

## 2023-07-06 ENCOUNTER — Ambulatory Visit: Payer: BC Managed Care – PPO | Admitting: Adult Health

## 2023-07-06 VITALS — BP 130/80 | HR 67 | Temp 97.9°F | Ht 74.0 in | Wt 284.0 lb

## 2023-07-06 DIAGNOSIS — L409 Psoriasis, unspecified: Secondary | ICD-10-CM

## 2023-07-06 MED ORDER — CLOTRIMAZOLE-BETAMETHASONE 1-0.05 % EX CREA
1.0000 | TOPICAL_CREAM | Freq: Two times a day (BID) | CUTANEOUS | 2 refills | Status: AC
Start: 2023-07-06 — End: ?

## 2023-07-06 NOTE — Progress Notes (Signed)
Subjective:    Patient ID: David Gamble, male    DOB: 1976-05-03, 47 y.o.   MRN: [id number]  HPI 47 year old male who  has a past medical history of Arthritis, Heart burn, Hemorrhoids, Hyperlipidemia, Overweight(278.02), Psoriasis, RLS (restless legs syndrome), Sleep apnea, and Tonsillar hypertrophy.  He presents to the office today for worsening psoriasis. He has patches on both arms, right knee, flank flank as well as on his scalp. He cannot see Dermatology until Dec/Jan.   He has not been using anything OTC for the psoriasis.   Review of Systems See HPI   Past Medical History:  Diagnosis Date   Arthritis    lower back/ RIGHT hip   Heart burn    Hemorrhoids    Hyperlipidemia    on meds   Overweight(278.02)    Psoriasis    RLS (restless legs syndrome)    Sleep apnea    s/p UPPP in June 2009   Tonsillar hypertrophy     Social History   Socioeconomic History   Marital status: Single    Spouse name: Not on file   Number of children: 1   Years of education: Not on file   Highest education level: 12th grade  Occupational History   Not on file  Tobacco Use   Smoking status: Never   Smokeless tobacco: Never  Vaping Use   Vaping status: Never Used  Substance and Sexual Activity   Alcohol use: No   Drug use: No   Sexual activity: Not on file  Other Topics Concern   Not on file  Social History Narrative   He works for [employer] -    One child ( 22)    [place] native    Social Determinants of Health   Financial Resource Strain: High Risk (07/06/2023)   Overall Financial Resource Strain (CARDIA)    Difficulty of Paying Living Expenses: Very hard  Food Insecurity: Food Insecurity Present (07/06/2023)   Hunger Vital Sign    Worried About Running Out of Food in the Last Year: Sometimes true    Ran Out of Food in the Last Year: Sometimes true  Transportation Needs: No Transportation Needs (07/06/2023)   PRAPARE - Administrator, Civil Service  (Medical): No    Lack of Transportation (Non-Medical): No  Physical Activity: Unknown (07/06/2023)   Exercise Vital Sign    Days of Exercise per Week: 0 days    Minutes of Exercise per Session: Not on file  Stress: No Stress Concern Present (07/06/2023)   [employer] of Occupational Health - Occupational Stress Questionnaire    Feeling of Stress : Only a little  Social Connections: Unknown (07/06/2023)   Social Connection and Isolation Panel [NHANES]    Frequency of Communication with Friends and Family: More than three times a week    Frequency of Social Gatherings with Friends and Family: Never    Attends Religious Services: Never    Database administrator or Organizations: No    Attends Engineer, structural: Not on file    Marital Status: Patient declined  Intimate Partner Violence: Unknown (12/22/2021)   Received from [employer], [facility]   HITS    Physically Hurt: Not on file    Insult or Talk Down To: Not on file    Threaten Physical Harm: Not on file    Scream or Curse: Not on file    Past Surgical History:  Procedure Laterality Date  FINGER SURGERY  2005   ORIF of 5th digit L hand   TONSILLECTOMY  2009    Family History  Problem Relation Age of Onset   Hypertension Mother    Depression Mother    Cirrhosis Father    Alcohol abuse Father    Cancer Paternal Grandmother    Alzheimer's disease Paternal Grandfather    COPD Other    Lung cancer Other    Cirrhosis Other    Colon polyps Neg Hx    Esophageal cancer Neg Hx    Rectal cancer Neg Hx    Stomach cancer Neg Hx     No Known Allergies  Current Outpatient Medications on File Prior to Visit  Medication Sig Dispense Refill   albuterol (VENTOLIN HFA) 108 (90 Base) MCG/ACT inhaler Inhale 2 puffs into the lungs every 6 (six) hours as needed for wheezing or shortness of breath. 8 g 0   azelastine (ASTELIN) 0.1 % nasal spray Place 1 spray into both nostrils daily. Use in each nostril as  directed 30 mL 12   fluticasone (FLONASE) 50 MCG/ACT nasal spray Place 1 spray into both nostrils at bedtime. 16 g 5   fluticasone-salmeterol (WIXELA INHUB) 250-50 MCG/ACT AEPB Inhale 1 puff into the lungs in the morning and at bedtime. 60 each 1   levalbuterol (XOPENEX HFA) 45 MCG/ACT inhaler Inhale 1-2 puffs into the lungs every 6 (six) hours as needed for wheezing. 1 each 2   omeprazole (PRILOSEC) 40 MG capsule Take 1 capsule (40 mg total) by mouth daily. 30 capsule 2   traZODone (DESYREL) 50 MG tablet TAKE 1 TABLET BY MOUTH AT BEDTIME AS NEEDED FOR SLEEP. 90 tablet 1   No current facility-administered medications on file prior to visit.    BP 130/80   Pulse 67   Temp 97.9 F (36.6 C) (Oral)   Ht 6\' 2"  (1.88 m)   Wt 284 lb (128.8 kg)   SpO2 97%   BMI 36.46 kg/m       Objective:   Physical Exam Vitals and nursing note reviewed.  Constitutional:      Appearance: Normal appearance.  Musculoskeletal:        General: Normal range of motion.  Skin:    General: Skin is warm and dry.     Findings: Rash present.     Comments: Red, raised, scaly patches throughout his body   Neurological:     Mental Status: He is alert.  Psychiatric:        Mood and Affect: Mood normal.        Behavior: Behavior normal.        Assessment & Plan:  1. Psoriasis  - clotrimazole-betamethasone (LOTRISONE) cream; Apply 1 Application topically 2 (two) times daily. Apply thin layer twice daily x 2 weeks  Dispense: 45 g; Refill: 2 - Also advised Nizoral psoriasis shampoo.   Shirline Frees, NP  Time spent with patient today was 30 minutes which consisted of chart review, discussing psoriasis,  work up, treatment answering questions and documentation.

## 2023-07-17 ENCOUNTER — Ambulatory Visit: Payer: BC Managed Care – PPO | Admitting: Adult Health

## 2023-07-17 ENCOUNTER — Encounter: Payer: Self-pay | Admitting: Adult Health

## 2023-07-17 VITALS — BP 120/88 | HR 65 | Temp 97.8°F | Ht 74.0 in | Wt 279.0 lb

## 2023-07-17 DIAGNOSIS — L918 Other hypertrophic disorders of the skin: Secondary | ICD-10-CM

## 2023-07-17 NOTE — Progress Notes (Signed)
S: The patient complains of symptomatic skin tags on the left waist. These are irritated by clothing, rubbing and wearing a belt  O: Patient appears well. 1 cm benign skin tag is  noted on the left waist.   A: Skin tags   P: Skin tag was snipped off using Alcohol  for cleansing and sterile iris scissors. Local anesthesia was used. These pathognomonic lesions are not sent for pathology.

## 2023-07-26 ENCOUNTER — Telehealth: Payer: Self-pay | Admitting: Adult Health

## 2023-07-26 DIAGNOSIS — G4733 Obstructive sleep apnea (adult) (pediatric): Secondary | ICD-10-CM

## 2023-07-26 NOTE — Telephone Encounter (Signed)
Lm x1 for patient.  

## 2023-07-26 NOTE — Telephone Encounter (Signed)
Recent started on CPAP therapy.  CPAP download October 8 through November 6 shows excellent compliance with 100% usage.  Daily average usage at 7 hours.  AHI 1.9.  CPAP is working very well.  He had an overnight oximetry test done on July 02, 2023 that showed 31 minutes O2 saturations on CPAP were less than 88%.  Average O2 saturation was 92%.  With these episodes of low oxygen on CPAP.  Recommend a CPAP titration study to evaluate need for oxygen with CPAP.  Rx CPAP titration

## 2023-07-27 NOTE — Telephone Encounter (Signed)
Patient is aware of results and voiced his understanding.  Verified that ONO was on cpap. He agreed with titration study. Order has been placed. Nothing further needed.

## 2023-09-04 ENCOUNTER — Ambulatory Visit (HOSPITAL_BASED_OUTPATIENT_CLINIC_OR_DEPARTMENT_OTHER): Payer: BC Managed Care – PPO | Attending: Adult Health | Admitting: Pulmonary Disease

## 2023-09-04 DIAGNOSIS — G4733 Obstructive sleep apnea (adult) (pediatric): Secondary | ICD-10-CM | POA: Insufficient documentation

## 2023-09-06 DIAGNOSIS — G4733 Obstructive sleep apnea (adult) (pediatric): Secondary | ICD-10-CM

## 2023-09-06 NOTE — Procedures (Signed)
Patient Name: David Gamble, David Gamble Date: 09/04/2023 Gender: Male D.O.B: 1975-10-09 Age (years): 47 Referring Provider: Tammy Parrett Height (inches): 74 Interpreting Physician: Cyril Mourning MD, ABSM Weight (lbs): 280 RPSGT: Armen Pickup BMI: 36 MRN: [id number] Neck Size: 17.00 <br> <br> CLINICAL INFORMATION The patient is referred for a CPAP titration to treat sleep apnea.    ? PSG 10/29/07 >> AHI 24, SpO2 low 79% ? PSG 09/01/16 >> AHI 4.8, SpO2 low 91% ? HST 03/30/23 >> AHI 81.3, SpO2 low 50%. Spent 246 min with SpO2 < 88%.  SLEEP STUDY TECHNIQUE As per the AASM Manual for the Scoring of Sleep and Associated Events v2.3 (April 2016) with a hypopnea requiring 4% desaturations.  The channels recorded and monitored were frontal, central and occipital EEG, electrooculogram (EOG), submentalis EMG (chin), nasal and oral airflow, thoracic and abdominal wall motion, anterior tibialis EMG, snore microphone, electrocardiogram, and pulse oximetry. Continuous positive airway pressure (CPAP) was initiated at the beginning of the study and titrated to treat sleep-disordered breathing.  MEDICATIONS Medications self-administered by patient taken the night of the study : MELATONIN, REQUIP  TECHNICIAN COMMENTS Comments added by technician: Patient had difficulty initiating sleep. Pt had two restroom visted Comments added by scorer: N/A RESPIRATORY PARAMETERS Optimal PAP Pressure (cm): 10 AHI at Optimal Pressure (/hr): 0.2 Overall Minimal O2 (%): 86.0 Supine % at Optimal Pressure (%): 20 Minimal O2 at Optimal Pressure (%): 87.0   SLEEP ARCHITECTURE The study was initiated at 9:26:58 PM and ended at 3:20:57 AM.  Sleep onset time was 15.7 minutes and the sleep efficiency was 81.5%. The total sleep time was 288.5 minutes.  The patient spent 3.3% of the night in stage N1 sleep, 33.3% in stage N2 sleep, 26.0% in stage N3 and 37.4% in REM.Stage REM latency was 113.5 minutes  Wake after sleep onset  was 49.7. Alpha intrusion was absent. Supine sleep was 17.59%.  CARDIAC DATA The 2 lead EKG demonstrated sinus rhythm. The mean heart rate was 59.5 beats per minute. Other EKG findings include: None.   LEG MOVEMENT DATA The total Periodic Limb Movements of Sleep (PLMS) were 0. The PLMS index was 0.0. A PLMS index of <15 is considered normal in adults.  IMPRESSIONS - The optimal PAP pressure was 10 cm of water. - Central sleep apnea was not noted during this titration (CAI = 0.2/h). - Moderate oxygen desaturations were observed during this titration (min O2 = 86.0%).Oxygen was not required - No snoring was audible during this study. - No cardiac abnormalities were observed during this study. - Clinically significant periodic limb movements were not noted during this study. Arousals associated with PLMs were rare.   DIAGNOSIS - Obstructive Sleep Apnea (G47.33)   RECOMMENDATIONS - Trial of CPAP therapy on 10 cm H2O with a Medium size Fisher&Paykel Full Face Simplus mask and heated humidification. - Avoid alcohol, sedatives and other CNS depressants that may worsen sleep apnea and disrupt normal sleep architecture. - Sleep hygiene should be reviewed to assess factors that may improve sleep quality. - Weight management and regular exercise should be initiated or continued. - Return to Sleep Center for re-evaluation after 4 weeks of therapy  Cyril Mourning MD Board Certified in Sleep medicine

## 2023-09-10 ENCOUNTER — Telehealth: Payer: Self-pay | Admitting: Adult Health

## 2023-09-10 DIAGNOSIS — G4733 Obstructive sleep apnea (adult) (pediatric): Secondary | ICD-10-CM

## 2023-09-10 NOTE — Telephone Encounter (Signed)
CPAP titration study done showed the following The optimal PAP pressure was 10 cm of water. - Central sleep apnea was not noted during this titration (CAI = 0.2/h). - Moderate oxygen desaturations were observed during this titration (min O2 = 86.0%).Oxygen was not required Please let patient know that we adjusted his CPAP pressure-order to dme  Please make follow-up in the next 6 to 8 weeks in office or virtual

## 2023-09-10 NOTE — Telephone Encounter (Signed)
Spoke with patient regarding results and recommendations. Patient voiced understanding and is aware to schedule follow up visit. No further questions or concerns.

## 2023-09-14 ENCOUNTER — Telehealth: Payer: Self-pay | Admitting: Pulmonary Disease

## 2023-09-14 DIAGNOSIS — G4733 Obstructive sleep apnea (adult) (pediatric): Secondary | ICD-10-CM

## 2023-09-14 NOTE — Telephone Encounter (Signed)
Called and spoke with the pt  He states that he is overall doing okay using CPAP, but he noticed on the resmed app that he has been having events  He says that he sometimes has to left up the mask to scratch his nose  I printed DL and scanned to Dr. Reginia Naas email  Please review and advise if any changes are needed for him, thanks!

## 2023-09-14 NOTE — Telephone Encounter (Signed)
PT calling because we changed him from a nose "mask" to a full face and also changed his pressure to 10. Since then he has had  several events (in the 30's). His # is [phone]

## 2023-09-21 NOTE — Telephone Encounter (Signed)
 Marland Kitchen

## 2023-11-16 ENCOUNTER — Ambulatory Visit: Payer: BC Managed Care – PPO | Admitting: Adult Health

## 2023-12-05 ENCOUNTER — Encounter: Payer: Self-pay | Admitting: Adult Health

## 2023-12-05 ENCOUNTER — Ambulatory Visit: Admitting: Adult Health

## 2023-12-05 VITALS — BP 120/80 | HR 90 | Temp 98.5°F | Ht 74.0 in | Wt 270.0 lb

## 2023-12-05 DIAGNOSIS — U071 COVID-19: Secondary | ICD-10-CM

## 2023-12-05 LAB — POC COVID19 BINAXNOW: SARS Coronavirus 2 Ag: POSITIVE — AB

## 2023-12-05 LAB — POCT INFLUENZA A/B
Influenza A, POC: NEGATIVE
Influenza B, POC: NEGATIVE

## 2023-12-05 NOTE — Progress Notes (Signed)
 Subjective:    Patient ID: David Gamble, male    DOB: Oct 14, 1975, 48 y.o.   MRN: [id number]  Fever    48 year male who  has a past medical history of Arthritis, Heart burn, Hemorrhoids, Hyperlipidemia, Overweight(278.02), Psoriasis, RLS (restless legs syndrome), Sleep apnea, and Tonsillar hypertrophy.  He presents to the office today for concern of Covid 19. He had close contact with someone who tested positive for Covid 19 two days ago.   Today he developed a  mild cough, ? Low grade fever, headache. Denies any other symptoms. Does not feel acutely ill.    Review of Systems  Constitutional:  Positive for fever.   See HPI   Past Medical History:  Diagnosis Date   Arthritis    lower back/ RIGHT hip   Heart burn    Hemorrhoids    Hyperlipidemia    on meds   Overweight(278.02)    Psoriasis    RLS (restless legs syndrome)    Sleep apnea    s/p UPPP in June 2009   Tonsillar hypertrophy     Social History   Socioeconomic History   Marital status: Single    Spouse name: Not on file   Number of children: 1   Years of education: Not on file   Highest education level: 12th grade  Occupational History   Not on file  Tobacco Use   Smoking status: Never   Smokeless tobacco: Never  Vaping Use   Vaping status: Never Used  Substance and Sexual Activity   Alcohol use: No   Drug use: No   Sexual activity: Not on file  Other Topics Concern   Not on file  Social History Narrative   He works for [employer] -    One child ( 63)    [place] native    Social Drivers of Corporate investment banker Strain: High Risk (07/06/2023)   Overall Financial Resource Strain (CARDIA)    Difficulty of Paying Living Expenses: Very hard  Food Insecurity: Food Insecurity Present (07/06/2023)   Hunger Vital Sign    Worried About Running Out of Food in the Last Year: Sometimes true    Ran Out of Food in the Last Year: Sometimes true  Transportation Needs: No Transportation Needs  (07/06/2023)   PRAPARE - Administrator, Civil Service (Medical): No    Lack of Transportation (Non-Medical): No  Physical Activity: Unknown (07/06/2023)   Exercise Vital Sign    Days of Exercise per Week: 0 days    Minutes of Exercise per Session: Not on file  Stress: No Stress Concern Present (07/06/2023)   [employer] of Occupational Health - Occupational Stress Questionnaire    Feeling of Stress : Only a little  Social Connections: Unknown (07/06/2023)   Social Connection and Isolation Panel [NHANES]    Frequency of Communication with Friends and Family: More than three times a week    Frequency of Social Gatherings with Friends and Family: Never    Attends Religious Services: Never    Database administrator or Organizations: No    Attends Engineer, structural: Not on file    Marital Status: Patient declined  Intimate Partner Violence: Unknown (12/22/2021)   Received from [employer], [facility]   HITS    Physically Hurt: Not on file    Insult or Talk Down To: Not on file    Threaten Physical Harm: Not on file    Scream  or Curse: Not on file    Past Surgical History:  Procedure Laterality Date   FINGER SURGERY  2005   ORIF of 5th digit L hand   TONSILLECTOMY  2009    Family History  Problem Relation Age of Onset   Hypertension Mother    Depression Mother    Cirrhosis Father    Alcohol abuse Father    Cancer Paternal Grandmother    Alzheimer's disease Paternal Grandfather    COPD Other    Lung cancer Other    Cirrhosis Other    Colon polyps Neg Hx    Esophageal cancer Neg Hx    Rectal cancer Neg Hx    Stomach cancer Neg Hx     No Known Allergies  Current Outpatient Medications on File Prior to Visit  Medication Sig Dispense Refill   albuterol (VENTOLIN HFA) 108 (90 Base) MCG/ACT inhaler Inhale 2 puffs into the lungs every 6 (six) hours as needed for wheezing or shortness of breath. 8 g 0   azelastine (ASTELIN) 0.1 % nasal spray  Place 1 spray into both nostrils daily. Use in each nostril as directed 30 mL 12   clotrimazole-betamethasone (LOTRISONE) cream Apply 1 Application topically 2 (two) times daily. Apply thin layer twice daily x 2 weeks 45 g 2   fluticasone (FLONASE) 50 MCG/ACT nasal spray Place 1 spray into both nostrils at bedtime. 16 g 5   fluticasone-salmeterol (WIXELA INHUB) 250-50 MCG/ACT AEPB Inhale 1 puff into the lungs in the morning and at bedtime. 60 each 1   levalbuterol (XOPENEX HFA) 45 MCG/ACT inhaler Inhale 1-2 puffs into the lungs every 6 (six) hours as needed for wheezing. 1 each 2   omeprazole (PRILOSEC) 40 MG capsule Take 1 capsule (40 mg total) by mouth daily. 30 capsule 2   traZODone (DESYREL) 50 MG tablet TAKE 1 TABLET BY MOUTH AT BEDTIME AS NEEDED FOR SLEEP. 90 tablet 1   No current facility-administered medications on file prior to visit.    BP 120/80   Pulse 90   Temp 98.5 F (36.9 C) (Oral)   Ht 6\' 2"  (1.88 m)   Wt 270 lb (122.5 kg)   SpO2 96%   BMI 34.67 kg/m       Objective:   Physical Exam Vitals and nursing note reviewed.  Constitutional:      Appearance: Normal appearance.  Cardiovascular:     Rate and Rhythm: Normal rate and regular rhythm.     Pulses: Normal pulses.     Heart sounds: Normal heart sounds.  Pulmonary:     Effort: Pulmonary effort is normal.     Breath sounds: Normal breath sounds.  Musculoskeletal:        General: Normal range of motion.  Skin:    General: Skin is warm and dry.  Neurological:     General: No focal deficit present.     Mental Status: He is alert and oriented to person, place, and time.  Psychiatric:        Mood and Affect: Mood normal.        Behavior: Behavior normal.        Thought Content: Thought content normal.        Judgment: Judgment normal.        Assessment & Plan:  1. COVID-19 virus infection (Primary)  - POC COVID-19- Positive - POC Influenza A/B- Negative - Discussed treatment with antivirals. His  symptoms are mild and we will forgo antivirals at this time. If  his symptoms change he will send me a mychart message  Shirline Frees, NP

## 2023-12-06 ENCOUNTER — Ambulatory Visit: Admitting: Adult Health

## 2023-12-24 ENCOUNTER — Encounter: Payer: Self-pay | Admitting: Adult Health

## 2023-12-24 ENCOUNTER — Ambulatory Visit (INDEPENDENT_AMBULATORY_CARE_PROVIDER_SITE_OTHER): Payer: BC Managed Care – PPO | Admitting: Adult Health

## 2023-12-24 VITALS — BP 130/80 | HR 62 | Ht 74.0 in | Wt 274.6 lb

## 2023-12-24 DIAGNOSIS — G4733 Obstructive sleep apnea (adult) (pediatric): Secondary | ICD-10-CM

## 2023-12-24 NOTE — Patient Instructions (Addendum)
 Healthy sleep regimen  Activity /exercise as able  Continue on CPAP At bedtime, wear all night  Keep up good work.  Call back if you can not tolerate and want to start process for Inspire .  Follow up in 1 year and As needed.

## 2023-12-24 NOTE — Assessment & Plan Note (Signed)
 Severe obstructive sleep apnea.  Patient is having difficulty with CPAP tolerance.  He does have good usage and control on CPAP but CPAP mask is very uncomfortable.  If unable to continue to tolerate may need to consider evaluation for inspire.  Patient education was given.  Patient is aware to call back if he decides to proceed with evaluation for inspire  Plan  Patient Instructions  Healthy sleep regimen  Activity /exercise as able  Continue on CPAP At bedtime, wear all night  Keep up good work.  Call back if you can not tolerate and want to start process for Inspire .  Follow up in 1 year and As needed.

## 2023-12-24 NOTE — Progress Notes (Signed)
 @Patient  ID: David Gamble, male    DOB: December 10, 1975, 48 y.o.   MRN: [id number]  Chief Complaint  Patient presents with   Follow-up    Referring provider: Shirline Frees, NP  HPI: 48 yo male followed for severe obstructive sleep apnea  TEST/EVENTS :  PSG 10/29/07 >> AHI 24, SpO2 low 79% PSG 09/01/16 >> AHI 4.8, SpO2 low 91% HST 03/30/23 >> AHI 81.3, SpO2 low 50%. Spent 246 min with SpO2 < 88%.   12/24/2023 Follow up: OSA  Patient returns for a 67-month follow-up.  Patient has severe obstructive sleep apnea is on nocturnal CPAP.  Patient says he is wearing the CPAP very still night.  Patient still is very uncomfortable.  Mask moves around constantly.  He has tried several different mask before currently is using a fullface mask.  CPAP download shows excellent compliance with daily average usage around 8 hours.  He is on auto CPAP 10 to 15 cm H2O.  Daily average pressure at 13.5 cm H2O.  AHI 4.2/hour.  Patient says he still feels tired because he is very restless at nighttime. We discussed different mask options.  He wants to stay with the current mask he has.  We did talk about he has a full beard and mustache.  That this may be causing the current CPAP mask to leak.  Patient says he will try to shade and see if this makes any difference.  He did want information on the inspire device.  We did talk about inspire with patient education provided.  Patient says he cannot get CPAP to work he is interested in proceeding with the inspire evaluation.  No Known Allergies  Immunization History  Administered Date(s) Administered   Influenza Split 10/19/2016   Influenza,inj,Quad PF,6+ Mos 05/19/2016   Tdap 03/01/2012    Past Medical History:  Diagnosis Date   Arthritis    lower back/ RIGHT hip   Heart burn    Hemorrhoids    Hyperlipidemia    on meds   Overweight(278.02)    Psoriasis    RLS (restless legs syndrome)    Sleep apnea    s/p UPPP in June 2009   Tonsillar hypertrophy      Tobacco History: Social History   Tobacco Use  Smoking Status Never  Smokeless Tobacco Never   Counseling given: Not Answered   Outpatient Medications Prior to Visit  Medication Sig Dispense Refill   traZODone (DESYREL) 50 MG tablet TAKE 1 TABLET BY MOUTH AT BEDTIME AS NEEDED FOR SLEEP. 90 tablet 1   albuterol (VENTOLIN HFA) 108 (90 Base) MCG/ACT inhaler Inhale 2 puffs into the lungs every 6 (six) hours as needed for wheezing or shortness of breath. (Patient not taking: Reported on 12/24/2023) 8 g 0   azelastine (ASTELIN) 0.1 % nasal spray Place 1 spray into both nostrils daily. Use in each nostril as directed (Patient not taking: Reported on 12/24/2023) 30 mL 12   clotrimazole-betamethasone (LOTRISONE) cream Apply 1 Application topically 2 (two) times daily. Apply thin layer twice daily x 2 weeks (Patient not taking: Reported on 12/24/2023) 45 g 2   fluticasone (FLONASE) 50 MCG/ACT nasal spray Place 1 spray into both nostrils at bedtime. (Patient not taking: Reported on 12/24/2023) 16 g 5   fluticasone-salmeterol (WIXELA INHUB) 250-50 MCG/ACT AEPB Inhale 1 puff into the lungs in the morning and at bedtime. (Patient not taking: Reported on 12/24/2023) 60 each 1   levalbuterol (XOPENEX HFA) 45 MCG/ACT inhaler Inhale 1-2 puffs into the  lungs every 6 (six) hours as needed for wheezing. (Patient not taking: Reported on 12/24/2023) 1 each 2   omeprazole (PRILOSEC) 40 MG capsule Take 1 capsule (40 mg total) by mouth daily. (Patient not taking: Reported on 12/24/2023) 30 capsule 2   No facility-administered medications prior to visit.     Review of Systems:   Constitutional:   No  weight loss, night sweats,  Fevers, chills,+ fatigue, or  lassitude.  HEENT:   No headaches,  Difficulty swallowing,  Tooth/dental problems, or  Sore throat,                No sneezing, itching, ear ache, nasal congestion, post nasal drip,   CV:  No chest pain,  Orthopnea, PND, swelling in lower extremities, anasarca,  dizziness, palpitations, syncope.   GI  No heartburn, indigestion, abdominal pain, nausea, vomiting, diarrhea, change in bowel habits, loss of appetite, bloody stools.   Resp: No shortness of breath with exertion or at rest.  No excess mucus, no productive cough,  No non-productive cough,  No coughing up of blood.  No change in color of mucus.  No wheezing.  No chest wall deformity  Skin: no rash or lesions.  GU: no dysuria, change in color of urine, no urgency or frequency.  No flank pain, no hematuria   MS:  No joint pain or swelling.  No decreased range of motion.  No back pain.    Physical Exam  BP 130/80 (BP Location: Left Arm, Patient Position: Sitting, Cuff Size: Large)   Pulse 62   Ht 6\' 2"  (1.88 m)   Wt 274 lb 9.6 oz (124.6 kg)   SpO2 98%   BMI 35.26 kg/m   GEN: A/Ox3; pleasant , NAD, well nourished    HEENT:  [place]/AT,  , NOSE-clear, THROAT-clear, no lesions, no postnasal drip or exudate noted.   NECK:  Supple w/ fair ROM; no JVD; normal carotid impulses w/o bruits; no thyromegaly or nodules palpated; no lymphadenopathy.    RESP  Clear  P & A; w/o, wheezes/ rales/ or rhonchi. no accessory muscle use, no dullness to percussion  CARD:  RRR, no m/r/g, no peripheral edema, pulses intact, no cyanosis or clubbing.  GI:   Soft & nt; nml bowel sounds; no organomegaly or masses detected.   Musco: Warm bil, no deformities or joint swelling noted.   Neuro: alert, no focal deficits noted.    Skin: Warm, no lesions or rashes    Lab Results:  CBC  BMET  No results found for: "BNP"  ProBNP No results found for: "PROBNP"  Imaging: No results found.  Administration History     None           No data to display          No results found for: "NITRICOXIDE"      Assessment & Plan:   OSA (obstructive sleep apnea) Severe obstructive sleep apnea.  Patient is having difficulty with CPAP tolerance.  He does have good usage and control on CPAP but CPAP  mask is very uncomfortable.  If unable to continue to tolerate may need to consider evaluation for inspire.  Patient education was given.  Patient is aware to call back if he decides to proceed with evaluation for inspire  Plan  Patient Instructions  Healthy sleep regimen  Activity /exercise as able  Continue on CPAP At bedtime, wear all night  Keep up good work.  Call back if you can not tolerate and  want to start process for Inspire .  Follow up in 1 year and As needed.       Rubye Oaks, NP 12/24/2023

## 2024-01-17 ENCOUNTER — Ambulatory Visit: Admitting: Adult Health

## 2024-01-25 ENCOUNTER — Telehealth: Payer: Self-pay | Admitting: Adult Health

## 2024-01-25 NOTE — Telephone Encounter (Signed)
 Cmn received from [employer] for CPAP supplies.

## 2024-02-01 ENCOUNTER — Ambulatory Visit: Admitting: Adult Health

## 2024-02-01 VITALS — BP 130/70 | HR 76 | Temp 97.6°F | Ht 74.0 in | Wt 274.0 lb

## 2024-02-01 DIAGNOSIS — K649 Unspecified hemorrhoids: Secondary | ICD-10-CM | POA: Diagnosis not present

## 2024-02-01 DIAGNOSIS — B356 Tinea cruris: Secondary | ICD-10-CM

## 2024-02-01 DIAGNOSIS — L739 Follicular disorder, unspecified: Secondary | ICD-10-CM | POA: Diagnosis not present

## 2024-02-01 MED ORDER — TERBINAFINE HCL 250 MG PO TABS: 250.0000 mg | ORAL_TABLET | Freq: Every day | ORAL | 1 refills | Status: DC

## 2024-02-01 MED ORDER — DOXYCYCLINE HYCLATE 100 MG PO CAPS: 100.0000 mg | ORAL_CAPSULE | Freq: Two times a day (BID) | ORAL | 0 refills | Status: AC

## 2024-02-01 NOTE — Progress Notes (Signed)
 Subjective:    Patient ID: David Gamble, male    DOB: [birth year], 48 y.o.   MRN: [id number]  HPI 48 year old male who  has a past medical history of Arthritis, Heart burn, Hemorrhoids, Hyperlipidemia, Overweight(278.02), Psoriasis, RLS (restless legs syndrome), Sleep apnea, and Tonsillar hypertrophy.  He presents to the office today for an acute on chronic issue. He has a history of tinea cruris on multiple occasions over the last few years. He reports that for the last month and half he has noticed a red rash that is " very itchy" and burns. We have used oral lamisil  in the past on him which has worked well. Topicals have not worked in the past.   He does keep practice good hygiene and showers every night and after he goes to the gym. He also uses powder to help keep himself dry.   He also reports " I have these boils popping up in my groin and on his legs"   Wt Readings from Last 3 Encounters:  02/01/24 274 lb (124.3 kg)  12/24/23 274 lb 9.6 oz (124.6 kg)  12/05/23 270 lb (122.5 kg)   Would also like to be referred back to [place] [facility] to discuss hemorrhoid surgery. He had to cancel his last appointment due to having Covid    Review of Systems See HPI   Past Medical History:  Diagnosis Date   Arthritis    lower back/ RIGHT hip   Heart burn    Hemorrhoids    Hyperlipidemia    on meds   Overweight(278.02)    Psoriasis    RLS (restless legs syndrome)    Sleep apnea    s/p UPPP in June 2009   Tonsillar hypertrophy     Social History   Socioeconomic History   Marital status: Single    Spouse name: Not on file   Number of children: 1   Years of education: Not on file   Highest education level: 12th grade  Occupational History   Not on file  Tobacco Use   Smoking status: Never   Smokeless tobacco: Never  Vaping Use   Vaping status: Never Used  Substance and Sexual Activity   Alcohol use: No   Drug use: No   Sexual activity: Not on file  Other  Topics Concern   Not on file  Social History Narrative   He works for [employer] -    One child ( 74)    [place]  native    Social Drivers of Corporate investment banker Strain: High Risk (02/01/2024)   Overall Financial Resource Strain (CARDIA)    Difficulty of Paying Living Expenses: Very hard  Food Insecurity: Food Insecurity Present (02/01/2024)   Hunger Vital Sign    Worried About Running Out of Food in the Last Year: Sometimes true    Ran Out of Food in the Last Year: Sometimes true  Transportation Needs: No Transportation Needs (02/01/2024)   PRAPARE - Administrator, Civil Service (Medical): No    Lack of Transportation (Non-Medical): No  Physical Activity: Sufficiently Active (02/01/2024)   Exercise Vital Sign    Days of Exercise per Week: 5 days    Minutes of Exercise per Session: 40 min  Stress: No Stress Concern Present (02/01/2024)   [employer] of Occupational Health - Occupational Stress Questionnaire    Feeling of Stress : Only a little  Social Connections: Unknown (02/01/2024)   Social Connection and Isolation  Panel [NHANES]    Frequency of Communication with Friends and Family: More than three times a week    Frequency of Social Gatherings with Friends and Family: Twice a week    Attends Religious Services: Never    Database administrator or Organizations: No    Attends Engineer, structural: Not on file    Marital Status: Patient declined  Intimate Partner Violence: Unknown (12/22/2021)   Received from [employer] [facility], [facility]   HITS    Physically Hurt: Not on file    Insult or Talk Down To: Not on file    Threaten Physical Harm: Not on file    Scream or Curse: Not on file    Past Surgical History:  Procedure Laterality Date   FINGER SURGERY  2005   ORIF of 5th digit L hand   TONSILLECTOMY  2009    Family History  Problem Relation Age of Onset   Hypertension Mother    Depression Mother    Cirrhosis Father    Alcohol  abuse Father    Cancer Paternal Grandmother    Alzheimer's disease Paternal Grandfather    COPD Other    Lung cancer Other    Cirrhosis Other    Colon polyps Neg Hx    Esophageal cancer Neg Hx    Rectal cancer Neg Hx    Stomach cancer Neg Hx     No Known Allergies  Current Outpatient Medications on File Prior to Visit  Medication Sig Dispense Refill   albuterol  (VENTOLIN  HFA) 108 (90 Base) MCG/ACT inhaler Inhale 2 puffs into the lungs every 6 (six) hours as needed for wheezing or shortness of breath. 8 g 0   azelastine  (ASTELIN ) 0.1 % nasal spray Place 1 spray into both nostrils daily. Use in each nostril as directed 30 mL 12   clobetasol (TEMOVATE) 0.05 % external solution Apply 1 Application topically.     clotrimazole -betamethasone  (LOTRISONE ) cream Apply 1 Application topically 2 (two) times daily. Apply thin layer twice daily x 2 weeks 45 g 2   fluticasone  (FLONASE ) 50 MCG/ACT nasal spray Place 1 spray into both nostrils at bedtime. 16 g 5   fluticasone -salmeterol (WIXELA INHUB ) 250-50 MCG/ACT AEPB Inhale 1 puff into the lungs in the morning and at bedtime. 60 each 1   levalbuterol  (XOPENEX  HFA) 45 MCG/ACT inhaler Inhale 1-2 puffs into the lungs every 6 (six) hours as needed for wheezing. 1 each 2   omeprazole  (PRILOSEC) 40 MG capsule Take 1 capsule (40 mg total) by mouth daily. 30 capsule 2   testosterone  cypionate (DEPOTESTOSTERONE CYPIONATE) 200 MG/ML injection Compounded with [place] includes [id number] 1 mL Intramuscular Once per week for 90 days     tiZANidine (ZANAFLEX) 4 MG tablet Take 4 mg by mouth 3 (three) times daily.     traZODone  (DESYREL ) 50 MG tablet TAKE 1 TABLET BY MOUTH AT BEDTIME AS NEEDED FOR SLEEP. 90 tablet 1   VTAMA 1 % CREA Apply topically.     No current facility-administered medications on file prior to visit.    BP 130/70   Pulse 76   Temp 97.6 F (36.4 C) (Oral)   Ht 6\' 2"  (1.88 m)   Wt 274 lb (124.3 kg)   SpO2 97%   BMI 35.18 kg/m        Objective:   Physical Exam Vitals and nursing note reviewed.  Constitutional:      Appearance: Normal appearance.  Skin:    General: Skin is warm  and dry.     Findings: Erythema and rash present. Rash is pustular.     Comments: He has a flat red rash in bilateral groin folds.   He has patches of small pustules noted in groin and on the tops of his thighs   Neurological:     General: No focal deficit present.     Mental Status: He is alert and oriented to person, place, and time.  Psychiatric:        Mood and Affect: Mood normal.        Behavior: Behavior normal.        Thought Content: Thought content normal.        Judgment: Judgment normal.       Assessment & Plan:  1. Folliculitis (Primary)  - doxycycline  (VIBRAMYCIN ) 100 MG capsule; Take 1 capsule (100 mg total) by mouth 2 (two) times daily for 10 days.  Dispense: 20 capsule; Refill: 0  2. Tinea cruris - keep area clean and dry  - Continue to use powder  - terbinafine  (LAMISIL ) 250 MG tablet; Take 1 tablet (250 mg total) by mouth daily.  Dispense: 30 tablet; Refill: 1  3. Hemorrhoids, unspecified hemorrhoid type  - Ambulatory referral to General Surgery  Time spent with patient today was 32 minutes which consisted of chart review, discussing tinea, folliculitis and hemorrhoids, work up, treatment answering questions and documentation.

## 2024-02-04 NOTE — Telephone Encounter (Signed)
 Second request received from [employer] for cpap supplies and medical necessity

## 2024-02-15 NOTE — Telephone Encounter (Signed)
 Form received and placed in Tammy's review folder.

## 2024-02-26 ENCOUNTER — Ambulatory Visit: Payer: Self-pay | Admitting: Surgery

## 2024-03-03 NOTE — Telephone Encounter (Signed)
 Rc'd signed copy of [employer] CPAP supplies. Sending to fax [phone]

## 2024-03-05 ENCOUNTER — Telehealth: Payer: Self-pay | Admitting: Adult Health

## 2024-03-05 NOTE — Telephone Encounter (Signed)
 Rc'd another signed and dated copy of [employer] CMN. Will fax to [phone]. Once confirmed will attach and send to scan.

## 2024-03-05 NOTE — Telephone Encounter (Signed)
 This has been already been addressed.  Nothing further needed.

## 2024-03-20 NOTE — Telephone Encounter (Signed)
 NFN

## 2024-04-05 ENCOUNTER — Other Ambulatory Visit: Payer: Self-pay | Admitting: Adult Health

## 2024-04-05 DIAGNOSIS — B356 Tinea cruris: Secondary | ICD-10-CM

## 2024-04-18 ENCOUNTER — Ambulatory Visit: Admitting: Adult Health

## 2024-04-18 VITALS — BP 120/76 | HR 61 | Temp 98.0°F | Ht 74.0 in | Wt 258.0 lb

## 2024-04-18 DIAGNOSIS — B079 Viral wart, unspecified: Secondary | ICD-10-CM

## 2024-04-18 DIAGNOSIS — D492 Neoplasm of unspecified behavior of bone, soft tissue, and skin: Secondary | ICD-10-CM

## 2024-04-18 NOTE — Progress Notes (Signed)
 Subjective:    Patient ID: David Gamble, male    DOB: 26-May-1976, 48 y.o.   MRN: [id number]  HPI 48 year old male who  has a past medical history of Arthritis, Heart burn, Hemorrhoids, Hyperlipidemia, Overweight(278.02), Psoriasis, RLS (restless legs syndrome), Sleep apnea, and Tonsillar hypertrophy.  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 This neoplasm has not become any larger but has started to become irritated by clothing and movement. He would like to have it removed    Review of Systems See HPI   Past Medical History:  Diagnosis Date   Arthritis    lower back/ RIGHT hip   Heart burn    Hemorrhoids    Hyperlipidemia    on meds   Overweight(278.02)    Psoriasis    RLS (restless legs syndrome)    Sleep apnea    s/p UPPP in June 2009   Tonsillar hypertrophy     Social History   Socioeconomic History   Marital status: Single    Spouse name: Not on file   Number of children: 1   Years of education: Not on file   Highest education level: GED or equivalent  Occupational History   Not on file  Tobacco Use   Smoking status: Never   Smokeless tobacco: Never  Vaping Use   Vaping status: Never Used  Substance and Sexual Activity   Alcohol use: No   Drug use: No   Sexual activity: Not on file  Other Topics Concern   Not on file  Social History Narrative   He works for [employer] -    One child ( 19)    [place]  native    Social Drivers of Corporate investment banker Strain: High Risk (04/18/2024)   Overall Financial Resource Strain (CARDIA)    Difficulty of Paying Living Expenses: Hard  Food Insecurity: Food Insecurity Present (04/18/2024)   Hunger Vital Sign    Worried About Running Out of Food in the Last Year: Sometimes true    Ran Out of Food in the Last Year: Never true  Transportation Needs: No Transportation Needs (04/18/2024)   PRAPARE - Administrator, Civil Service (Medical): No    Lack of  Transportation (Non-Medical): No  Physical Activity: Sufficiently Active (04/18/2024)   Exercise Vital Sign    Days of Exercise per Week: 5 days    Minutes of Exercise per Session: 60 min  Stress: No Stress Concern Present (04/18/2024)   [employer] of Occupational Health - Occupational Stress Questionnaire    Feeling of Stress: Not at all  Social Connections: Socially Isolated (04/18/2024)   Social Connection and Isolation Panel    Frequency of Communication with Friends and Family: More than three times a week    Frequency of Social Gatherings with Friends and Family: Three times a week    Attends Religious Services: Never    Active Member of Clubs or Organizations: No    Attends Banker Meetings: Not on file    Marital Status: Never married  Intimate Partner Violence: Unknown (12/22/2021)   Received from [facility]   HITS    Physically Hurt: Not on file    Insult or Talk Down To: Not on file    Threaten Physical Harm: Not on file    Scream or Curse: Not on file    Past Surgical History:  Procedure Laterality Date   FINGER SURGERY  2005   ORIF of 5th digit L hand   TONSILLECTOMY  2009    Family History  Problem Relation Age of Onset   Hypertension Mother    Depression Mother    Cirrhosis Father    Alcohol abuse Father    Cancer Paternal Grandmother    Alzheimer's disease Paternal Grandfather    COPD Other    Lung cancer Other    Cirrhosis Other    Colon polyps Neg Hx    Esophageal cancer Neg Hx    Rectal cancer Neg Hx    Stomach cancer Neg Hx     No Known Allergies  Current Outpatient Medications on File Prior to Visit  Medication Sig Dispense Refill   anastrozole (ARIMIDEX) 1 MG tablet Take 0.5 mg by mouth daily.     clobetasol (TEMOVATE) 0.05 % external solution Apply 1 Application topically.     clotrimazole -betamethasone  (LOTRISONE ) cream Apply 1 Application topically 2 (two) times daily. Apply thin layer twice daily x 2 weeks 45 g 2    fluticasone  (FLONASE ) 50 MCG/ACT nasal spray Place 1 spray into both nostrils at bedtime. 16 g 5   meloxicam  (MOBIC ) 15 MG tablet Take 15 mg by mouth daily.     testosterone  cypionate (DEPOTESTOSTERONE CYPIONATE) 200 MG/ML injection Compounded with [place] includes BLUE10KIT 1 mL Intramuscular Once per week for 90 days     tiZANidine (ZANAFLEX) 4 MG tablet Take 4 mg by mouth 3 (three) times daily.     VTAMA 1 % CREA Apply topically.     albuterol  (VENTOLIN  HFA) 108 (90 Base) MCG/ACT inhaler Inhale 2 puffs into the lungs every 6 (six) hours as needed for wheezing or shortness of breath. 8 g 0   azelastine  (ASTELIN ) 0.1 % nasal spray Place 1 spray into both nostrils daily. Use in each nostril as directed 30 mL 12   fluticasone -salmeterol (WIXELA INHUB ) 250-50 MCG/ACT AEPB Inhale 1 puff into the lungs in the morning and at bedtime. 60 each 1   levalbuterol  (XOPENEX  HFA) 45 MCG/ACT inhaler Inhale 1-2 puffs into the lungs every 6 (six) hours as needed for wheezing. 1 each 2   omeprazole  (PRILOSEC) 40 MG capsule Take 1 capsule (40 mg total) by mouth daily. 30 capsule 2   terbinafine  (LAMISIL ) 250 MG tablet Take 1 tablet (250 mg total) by mouth daily. 30 tablet 1   traZODone  (DESYREL ) 50 MG tablet TAKE 1 TABLET BY MOUTH AT BEDTIME AS NEEDED FOR SLEEP. 90 tablet 1   No current facility-administered medications on file prior to visit.    BP 120/76   Pulse 61   Temp 98 F (36.7 C) (Oral)   Ht 6' 2 (1.88 m)   Wt 258 lb (117 kg)   SpO2 96%   BMI 33.13 kg/m       Objective:   Physical Exam Vitals and nursing note reviewed.  Constitutional:      Appearance: Normal appearance.  Cardiovascular:     Rate and Rhythm: Normal rate.  Skin:    General: Skin is warm.      Neurological:     Mental Status: He is alert.  Psychiatric:        Mood and Affect: Mood normal.        Behavior: Behavior normal.        Thought Content: Thought content normal.        Judgment: Judgment normal.        Assessment & Plan:  1. Skin neoplasm (Primary) Procedure  including risks/benefits explained to patient.  Questions were answered. After informed consent was obtained and a time out completed, site was cleansed with betadine and then alcohol. 1% Lidocaine  with epinephrine  was injected under lesion and then shave biopsy was performed. Area was cauterized to obtain hemostasis.  Pt tolerated procedure well.  Specimen sent for pathology review.  Pt instructed to keep the area dry for 24 hours and to contact us  if he develops redness, drainage or swelling at the site.  Pt may use tylenol  as needed for discomfort today.   - Dermatology pathology  Darleene Shape, NP

## 2024-04-18 NOTE — Patient Instructions (Signed)
 Health Maintenance Due  Topic Date Due   COVID-19 Vaccine (1) Never done   Hepatitis B Vaccines (1 of 3 - 19+ 3-dose series) Never done   DTaP/Tdap/Td (2 - Td or Tdap) 03/01/2022   INFLUENZA VACCINE  04/18/2024       07/06/2023    7:05 AM 01/31/2023    8:37 AM 10/20/2021   10:03 AM  Depression screen PHQ 2/9  Decreased Interest 2 3 0  Down, Depressed, Hopeless 1 1 1   PHQ - 2 Score 3 4 1   Altered sleeping 3 3 3   Tired, decreased energy 3 3 2   Change in appetite 3 2 0  Feeling bad or failure about yourself  2 1 1   Trouble concentrating 3 3 1   Moving slowly or fidgety/restless 0 2 0  Suicidal thoughts 0 0 0  PHQ-9 Score 17 18 8   Difficult doing work/chores Somewhat difficult Somewhat difficult Not difficult at all

## 2024-04-24 ENCOUNTER — Ambulatory Visit: Payer: Self-pay | Admitting: Adult Health

## 2024-04-24 LAB — DERMATOLOGY PATHOLOGY

## 2024-07-25 ENCOUNTER — Ambulatory Visit: Admitting: Adult Health

## 2024-07-25 VITALS — BP 130/80 | HR 68 | Temp 97.7°F | Wt 272.0 lb

## 2024-07-25 DIAGNOSIS — N6001 Solitary cyst of right breast: Secondary | ICD-10-CM | POA: Diagnosis not present

## 2024-07-25 NOTE — Progress Notes (Signed)
 Subjective:    Patient ID: David Gamble, male    DOB: Aug 11, 1976, 48 y.o.   MRN: [id number]  HPI Discussed the use of AI scribe software for clinical note transcription with the patient, who gave verbal consent to proceed.  History of Present Illness   David Gamble is a 48 year old male who presents with a small, non-painful lump on the right breast.  Approximately one week ago, he noticed a small, non-painful lump on the right breast. The lump is about the size of a pinky nail. It remains non-tender even with pressure. No redness or warmth is present in the area.        Review of Systems See HPI   Past Medical History:  Diagnosis Date   Arthritis    lower back/ RIGHT hip   Heart burn    Hemorrhoids    Hyperlipidemia    on meds   Overweight(278.02)    Psoriasis    RLS (restless legs syndrome)    Sleep apnea    s/p UPPP in June 2009   Tonsillar hypertrophy     Social History   Socioeconomic History   Marital status: Single    Spouse name: Not on file   Number of children: 1   Years of education: Not on file   Highest education level: GED or equivalent  Occupational History   Not on file  Tobacco Use   Smoking status: Never   Smokeless tobacco: Never  Vaping Use   Vaping status: Never Used  Substance and Sexual Activity   Alcohol use: No   Drug use: No   Sexual activity: Not on file  Other Topics Concern   Not on file  Social History Narrative   He works for [employer] -    One child ( 19)    [place]  native    Social Drivers of Corporate Investment Banker Strain: Medium Risk (07/25/2024)   Overall Financial Resource Strain (CARDIA)    Difficulty of Paying Living Expenses: Somewhat hard  Food Insecurity: Food Insecurity Present (07/25/2024)   Hunger Vital Sign    Worried About Running Out of Food in the Last Year: Sometimes true    Ran Out of Food in the Last Year: Sometimes true  Transportation Needs: No Transportation Needs (07/25/2024)    PRAPARE - Administrator, Civil Service (Medical): No    Lack of Transportation (Non-Medical): No  Physical Activity: Sufficiently Active (07/25/2024)   Exercise Vital Sign    Days of Exercise per Week: 5 days    Minutes of Exercise per Session: 60 min  Stress: No Stress Concern Present (07/25/2024)   [employer] of Occupational Health - Occupational Stress Questionnaire    Feeling of Stress: Only a little  Social Connections: Unknown (07/25/2024)   Social Connection and Isolation Panel    Frequency of Communication with Friends and Family: More than three times a week    Frequency of Social Gatherings with Friends and Family: Twice a week    Attends Religious Services: Never    Database Administrator or Organizations: No    Attends Engineer, Structural: Not on file    Marital Status: Patient declined  Intimate Partner Violence: Unknown (12/22/2021)   Received from [facility]   HITS    Physically Hurt: Not on file    Insult or Talk Down To: Not on file    Threaten Physical Harm: Not on file  Scream or Curse: Not on file    Past Surgical History:  Procedure Laterality Date   FINGER SURGERY  2005   ORIF of 5th digit L hand   TONSILLECTOMY  2009    Family History  Problem Relation Age of Onset   Hypertension Mother    Depression Mother    Cirrhosis Father    Alcohol abuse Father    Cancer Paternal Grandmother    Alzheimer's disease Paternal Grandfather    COPD Other    Lung cancer Other    Cirrhosis Other    Colon polyps Neg Hx    Esophageal cancer Neg Hx    Rectal cancer Neg Hx    Stomach cancer Neg Hx     No Known Allergies  Current Outpatient Medications on File Prior to Visit  Medication Sig Dispense Refill   anastrozole (ARIMIDEX) 1 MG tablet Take 0.5 mg by mouth daily.     clobetasol (TEMOVATE) 0.05 % external solution Apply 1 Application topically.     clotrimazole -betamethasone  (LOTRISONE ) cream Apply 1 Application topically 2  (two) times daily. Apply thin layer twice daily x 2 weeks 45 g 2   meloxicam  (MOBIC ) 15 MG tablet Take 15 mg by mouth daily.     testosterone  cypionate (DEPOTESTOSTERONE CYPIONATE) 200 MG/ML injection Compounded with [place] includes BLUE10KIT 1 mL Intramuscular Once per week for 90 days     tiZANidine (ZANAFLEX) 4 MG tablet Take 4 mg by mouth 3 (three) times daily.     VTAMA 1 % CREA Apply topically.     No current facility-administered medications on file prior to visit.    BP 130/80   Pulse 68   Temp 97.7 F (36.5 C) (Oral)   Wt 272 lb (123.4 kg)   SpO2 96%   BMI 34.92 kg/m       Objective:   Physical Exam Vitals and nursing note reviewed.  Constitutional:      Appearance: Normal appearance.  Chest:    Skin:    General: Skin is warm and dry.  Neurological:     General: No focal deficit present.     Mental Status: He is alert and oriented to person, place, and time.  Psychiatric:        Mood and Affect: Mood normal.        Behavior: Behavior normal.        Thought Content: Thought content normal.        Judgment: Judgment normal.        Assessment & Plan:  1. Solitary cyst of right breast (Primary) - Feels like a simple cyst. He is prone to boils. We discussed covering for boil but will hold off and see what happens. He will follow up if there are any changes   Darleene Shape, NP

## 2024-07-28 ENCOUNTER — Ambulatory Visit: Admitting: Nurse Practitioner

## 2024-08-28 ENCOUNTER — Ambulatory Visit: Admitting: Adult Health
